# Patient Record
Sex: Male | Born: 1955 | Race: Black or African American | Hispanic: No | Smoking: Never smoker
Health system: Southern US, Community
[De-identification: ages and names within clinical notes are randomized; demographics above are authoritative.]

## PROBLEM LIST (undated history)

## (undated) HISTORY — PX: HERNIA REPAIR: SHX51

---

## 1998-06-11 ENCOUNTER — Emergency Department (HOSPITAL_COMMUNITY): Admission: EM | Admit: 1998-06-11 | Discharge: 1998-06-11 | Payer: Self-pay | Admitting: Internal Medicine

## 1998-06-19 ENCOUNTER — Emergency Department (HOSPITAL_COMMUNITY): Admission: EM | Admit: 1998-06-19 | Discharge: 1998-06-19 | Payer: Self-pay | Admitting: Emergency Medicine

## 1999-02-07 ENCOUNTER — Encounter: Payer: Self-pay | Admitting: Emergency Medicine

## 1999-02-07 ENCOUNTER — Emergency Department (HOSPITAL_COMMUNITY): Admission: EM | Admit: 1999-02-07 | Discharge: 1999-02-07 | Payer: Self-pay | Admitting: Emergency Medicine

## 2002-01-26 ENCOUNTER — Emergency Department (HOSPITAL_COMMUNITY): Admission: EM | Admit: 2002-01-26 | Discharge: 2002-01-26 | Payer: Self-pay | Admitting: Emergency Medicine

## 2002-09-15 ENCOUNTER — Emergency Department (HOSPITAL_COMMUNITY): Admission: EM | Admit: 2002-09-15 | Discharge: 2002-09-15 | Payer: Self-pay | Admitting: Emergency Medicine

## 2005-10-13 ENCOUNTER — Emergency Department (HOSPITAL_COMMUNITY): Admission: EM | Admit: 2005-10-13 | Discharge: 2005-10-13 | Payer: Self-pay | Admitting: Emergency Medicine

## 2010-05-18 ENCOUNTER — Emergency Department (HOSPITAL_COMMUNITY): Payer: BC Managed Care – PPO

## 2010-05-18 ENCOUNTER — Emergency Department (HOSPITAL_COMMUNITY)
Admission: EM | Admit: 2010-05-18 | Discharge: 2010-05-18 | Disposition: A | Discharge status: Home / Self Care | Payer: BC Managed Care – PPO | Attending: Emergency Medicine | Admitting: Emergency Medicine

## 2010-05-18 DIAGNOSIS — R1032 Left lower quadrant pain: Secondary | ICD-10-CM | POA: Insufficient documentation

## 2010-05-18 DIAGNOSIS — M5430 Sciatica, unspecified side: Secondary | ICD-10-CM | POA: Insufficient documentation

## 2010-05-18 DIAGNOSIS — N400 Enlarged prostate without lower urinary tract symptoms: Secondary | ICD-10-CM | POA: Insufficient documentation

## 2010-05-18 DIAGNOSIS — M79609 Pain in unspecified limb: Secondary | ICD-10-CM | POA: Insufficient documentation

## 2010-05-18 DIAGNOSIS — R030 Elevated blood-pressure reading, without diagnosis of hypertension: Secondary | ICD-10-CM | POA: Insufficient documentation

## 2010-05-18 LAB — COMPREHENSIVE METABOLIC PANEL
ALT: 22 U/L (ref 0–53)
AST: 20 U/L (ref 0–37)
Albumin: 4.2 g/dL (ref 3.5–5.2)
Alkaline Phosphatase: 76 U/L (ref 39–117)
BUN: 11 mg/dL (ref 6–23)
CO2: 26 mEq/L (ref 19–32)
Calcium: 9.6 mg/dL (ref 8.4–10.5)
Chloride: 100 mEq/L (ref 96–112)
Creatinine, Ser: 0.76 mg/dL (ref 0.4–1.5)
GFR calc Af Amer: >60 mL/min (ref >60)
GFR calc non Af Amer: >60 mL/min (ref >60)
Glucose, Bld: 104 mg/dL — ABNORMAL HIGH (ref 70–99)
Potassium: 3.4 mEq/L — ABNORMAL LOW (ref 3.5–5.1)
Sodium: 135 mEq/L (ref 135–145)
Total Bilirubin: 0.4 mg/dL (ref 0.3–1.2)
Total Protein: 7.3 g/dL (ref 6.0–8.3)

## 2010-05-18 LAB — DIFFERENTIAL
Basophils Absolute: 0 10*3/uL (ref 0.0–0.1)
Basophils Relative: 0 % (ref 0–1)
Eosinophils Absolute: 0.1 10*3/uL (ref 0.0–0.7)
Eosinophils Relative: 1 % (ref 0–5)
Lymphocytes Relative: 13 % (ref 12–46)
Lymphs Abs: 1.3 10*3/uL (ref 0.7–4.0)
Monocytes Absolute: 0.7 10*3/uL (ref 0.1–1.0)
Monocytes Relative: 8 % (ref 3–12)
Neutro Abs: 7.4 10*3/uL (ref 1.7–7.7)
Neutrophils Relative %: 78 % — ABNORMAL HIGH (ref 43–77)

## 2010-05-18 LAB — CBC
HCT: 42.7 % (ref 39.0–52.0)
Hemoglobin: 14.4 g/dL (ref 13.0–17.0)
MCH: 28 pg (ref 26.0–34.0)
MCHC: 33.7 g/dL (ref 30.0–36.0)
MCV: 83.1 fL (ref 78.0–100.0)
Platelets: 182 10*3/uL (ref 150–400)
RBC: 5.14 MIL/uL (ref 4.22–5.81)
RDW: 12.9 % (ref 11.5–15.5)
WBC: 9.4 10*3/uL (ref 4.0–10.5)

## 2010-05-18 LAB — URINALYSIS, ROUTINE W REFLEX MICROSCOPIC
Bilirubin Urine: NEGATIVE
Glucose, UA: NEGATIVE mg/dL
Hgb urine dipstick: NEGATIVE
Ketones, ur: NEGATIVE mg/dL
Nitrite: NEGATIVE
Protein, ur: NEGATIVE mg/dL
Specific Gravity, Urine: 1.028 (ref 1.005–1.030)
Urobilinogen, UA: 0.2 mg/dL (ref 0.0–1.0)
pH: 5.5 (ref 5.0–8.0)

## 2010-05-18 MED ORDER — IOHEXOL 300 MG/ML  SOLN
100.0000 mL | Freq: Once | INTRAMUSCULAR | Status: AC | PRN
  Administered 2010-05-18: 100 mL via INTRAVENOUS

## 2010-05-19 LAB — URINE CULTURE
Colony Count: NO GROWTH
Culture  Setup Time: 201205091021
Culture: NO GROWTH

## 2010-11-09 ENCOUNTER — Inpatient Hospital Stay (INDEPENDENT_AMBULATORY_CARE_PROVIDER_SITE_OTHER)
Admission: RE | Admit: 2010-11-09 | Discharge: 2010-11-09 | Disposition: A | Discharge status: Home / Self Care | Payer: BC Managed Care – PPO | Source: Ambulatory Visit | Attending: Emergency Medicine | Admitting: Emergency Medicine

## 2010-11-09 DIAGNOSIS — L723 Sebaceous cyst: Secondary | ICD-10-CM

## 2013-12-15 ENCOUNTER — Emergency Department (HOSPITAL_COMMUNITY)
Admission: EM | Admit: 2013-12-15 | Discharge: 2013-12-15 | Disposition: A | Discharge status: Home / Self Care | Payer: BC Managed Care – PPO | Attending: Emergency Medicine | Admitting: Emergency Medicine

## 2013-12-15 ENCOUNTER — Encounter (HOSPITAL_COMMUNITY): Payer: Self-pay | Admitting: Emergency Medicine

## 2013-12-15 DIAGNOSIS — L02212 Cutaneous abscess of back [any part, except buttock]: Secondary | ICD-10-CM | POA: Diagnosis present

## 2013-12-15 DIAGNOSIS — L723 Sebaceous cyst: Secondary | ICD-10-CM | POA: Diagnosis not present

## 2013-12-15 NOTE — ED Notes (Signed)
Pt in a hurry to leave, did not want d/c vitals taken.

## 2013-12-15 NOTE — Discharge Instructions (Signed)
Epidermal Cyst An epidermal cyst is usually a small, painless lump under the skin. Cysts often occur on the face, neck, stomach, chest, or genitals. The cyst may be filled with a bad smelling paste. Do not pop your cyst. Popping the cyst can cause pain and puffiness (swelling). HOME CARE   Only take medicines as told by your doctor.  Take your medicine (antibiotics) as told. Finish it even if you start to feel better. GET HELP RIGHT AWAY IF:  Your cyst is tender, red, or puffy.  You are not getting better, or you are getting worse.  You have any questions or concerns. MAKE SURE YOU:  Understand these instructions.  Will watch your condition.  Will get help right away if you are not doing well or get worse. Document Released: 02/03/2004 Document Revised: 06/27/2011 Document Reviewed: 07/04/2010 [facility] Patient Information 2015 [employer], [place]. This information is not intended to replace advice given to you by your health care provider. Make sure you discuss any questions you have with your health care provider.   Emergency Department Resource Guide 1) Find a Doctor and Pay Out of Pocket Although you won't have to find out who is covered by your insurance plan, it is a good idea to ask around and get recommendations. You will then need to call the office and see if the doctor you have chosen will accept you as a new patient and what types of options they offer for patients who are self-pay. Some doctors offer discounts or will set up payment plans for their patients who do not have insurance, but you will need to ask so you aren't surprised when you get to your appointment.  2) Contact Your Local Health Department Not all health departments have doctors that can see patients for sick visits, but many do, so it is worth a call to see if yours does. If you don't know where your local health department is, you can check in your phone book. The CDC also has a tool to help you locate your  state's health department, and many state websites also have listings of all of their local health departments.  3) Find a Walk-in Clinic If your illness is not likely to be very severe or complicated, you may want to try a walk in clinic. These are popping up all over the country in pharmacies, drugstores, and shopping centers. They're usually staffed by nurse practitioners or physician assistants that have been trained to treat common illnesses and complaints. They're usually fairly quick and inexpensive. However, if you have serious medical issues or chronic medical problems, these are probably not your best option.  No Primary Care Doctor: - Call Health Connect at  [phone] - they can help you locate a primary care doctor that  accepts your insurance, provides certain services, etc. - Physician Referral Service- [phone]  Chronic Pain Problems: Organization         Address  Phone   Notes  [facility]  [phone] Patients need to be referred by their primary care doctor.   Medication Assistance: Organization         Address  Phone   Notes  [facility] [address] [place], [place] [address] [phone] --Must be a resident of [facility] [place] -- Must have NO insurance coverage whatsoever (no Medicaid/ Medicare, etc.) -- The pt. MUST have a primary care doctor that directs their care regularly and follows them in the community  Med[facility]ist  [phone]   [employer]  [phone]    Agencies that provide inexpensive medical care: Organization         Address  Phone   Notes  [facility]  [phone]   [facility]    [phone]   [facility] [address] [place], [place] [address] [phone]   [facility] [address] [place], [place] [phone]   Planned Parenthood    [phone]   [facility]    [phone]   Community Health and [facility]  [address], [place] Phone:  [phone], Fax:  [phone] Hours of Operation:  9 am - 6 pm, M-F.  Also accepts Medicaid/Medicare and self-pay.  [facility]  [address], [place] Phone: [phone], Fax: [phone]. Hours of Operation:  8:30 am - 5:30 pm, M-F.  Also accepts Medicaid and self-pay.  [facility] [place] [address] [place] Phone: [phone]   [facility] [address] Natasha Bence [place], [place] [phone], Ext. [phone] Mondays & Thursdays: 7-9 AM.  First 15 patients are seen on a first come, first serve basis.    Medicaid-accepting [facility] Providers:  Organization         Address  Phone   Notes  [facility] [address], [place] [phone] Also accepts self-pay patients.  [facility] [address] Laurell Josephs [place], [place]  [phone]   [facility] [address], [place] [phone]   [facility] [address] [place] [phone]   Renaye Rakers [address], [place]   [phone] Only accepts [place] Access [place] patients after they have their name applied to their card.   Self-Pay (no insurance) in [facility]:  Organization         Address  Phone   Notes  Sickle Cell Patients, [facility] [address] [place], [place] [phone]   [facility] [address] [place], [place] [phone]   [facility] [place]  [address], [place] [phone]   [facility]/Dr. Osei-Bonsu  [address] [place] or [address], [place] [phone] Phone number for both [place] and [place] locations is the same.  [facility] and [facility] [address] [place] [phone]   [facility] [address] [place] or [address] [phone]   [facility] [address], [place] [phone], phone; [phone], fax Sees patients 1st and 3rd Saturday of every month.  Must not qualify for public or private insurance (i.e. Medicaid, Medicare, [place] Health Choice, Veterans' Benefits)  Household income should be no more than 200% of the poverty level The clinic cannot treat you if you are pregnant or think you are pregnant  Sexually transmitted diseases are not treated at the clinic.    Dental Care: Organization         Address  Phone  Notes  [facility] [address] [place], [place] [phone] Accepts children up to age 38 who are enrolled in [place] or [place] Health Choice;  pregnant women with a Medicaid card; and children who have applied for Medicaid or [place] Health Choice, but were declined, whose parents can pay a reduced fee at time of service.  [facility] of [facility] [place]  [address], [place] [phone] Accepts children up to age 57 who are enrolled in [place] or [place] Health Choice; pregnant women with a Medicaid card; and children who have applied for Medicaid or [place] Health Choice, but were declined, whose parents can pay a reduced fee at time of service.  [facility]  [address], [place] [phone] Patients are seen by appointment only. Walk-ins are not accepted. [facility] will see patients 57 years of age and older. Monday - Tuesday (8am-5pm) Most Wednesdays (8:30-5pm) $30 per visit, cash only  [facility]  [address], [facility] [place] [phone]) [phone] Patients are seen by appointment only. Walk-ins are not accepted. [facility] will see patients 57 years of age and older. One Wednesday Evening (Monthly: Volunteer  Based).  $30 per visit, cash only  [employer] [facility]  [phone] for adults; Children under age 634, call Graduate Pediatric Dentistry at [phone]. Children aged 724-14, please call [phone] to request a pediatric application.  Dental services are provided in all areas of dental care including fillings, crowns and bridges, complete and partial dentures, implants, gum treatment, root canals, and extractions. Preventive care is also provided. Treatment is provided to both adults and children. Patients are selected via a lottery and there is often a waiting list.   [facility] [address], [place]  [phone] [email or web address]   [facility] [address], [place], [place] [phone], Ext. [phone] Second and Fourth Thursday of each month, opens at 6:30 AM; Clinic ends at 9 AM.  Patients are seen on a first-come first-served basis, and a limited number are seen during each clinic.   [facility]  [address], [place], [place] [phone]   Eligibility Requirements You must have lived in [place], [place], or [place] counties for at least the last three months.   You cannot be eligible for state or federal sponsored [place] Cityhealthcare insurance, including CIGNAVeterans Administration, [place]icaid, or [employer] EntertainmentMedicare.   You generally cannot be eligible for healthcare insurance through your employer.    How to apply: Eligibility screenings are held every Tuesday and Wednesday afternoon from 1:00 pm until 4:00 pm. You do not need an appointment for the interview!  [facility] [address], [place], [place] [phone]   [facility]  [phone]   [facility]  [phone]   [facility]  [phone]    Behavioral Health Resources in the Community: Intensive Outpatient Programs Organization         Address  Phone  Notes  [facility]  Services [address], [place], [place] [phone]   [facility] Outpatient [address], [place], [place] [phone]   [facility] [address], [place], [place]  [phone]   [facility] [address],  [place], [place] [phone] or [phone]   Substance Abuse Resources Organization         Address  Phone  Notes  [facility] Drug Services  [phone]   [facility]  [phone]   [facility]  [phone]  Floydene Flock  [phone]   Residential & Outpatient Substance Abuse Program  [phone]   Psychological Services Organization         Address  Phone  Notes  [facility] Behavior[facility] Health  [phone]   [facility] Services  [phone]   [facility] [address] [place] [phone] or [phone]    Mobile Crisis Teams Organization         Address  Phone  Notes  [facility], Mobile Crisis Care Unit  [phone]   [facility]  [address] [place], [place] [phone]   Doristine Locks [address] [place] [phone]    Self-Help/Support Groups Organization         Address  Phone             Notes  [facility] [place] - variety of support groups  336- I7437963 Call for more information  Narcotics Anonymous (NA), [facility] [address], [employer] [place]  2 meetings at this location   Statistician         Address  Phone  Notes  [facility] [address],    [place]  [phone]   [facility]  [address], [place] [address], [place], [place] [phone]   [facility] [address] [place], [place] [phone] Admissions: 8am-3pm M-F  [facility] [address] [place] [address],    [place], [place] [phone]   [facility] [address] [place], [place], [place] [phone]    [facility] [address],  [place], [place] [phone]   [facility] - Intensive Outpatient [address] Laurell Josephs [address], [place], [place] [phone]   [facility] ([facility] [address]  [place], [place] [phone] or [phone]   [facility] (RTS) [address] [place], [place] [phone] Accepts Medicaid  [facility] [address],  [place] [phone] Substance Abuse/Addiction Treatment   [facility] Organization         Address  Phone  Notes  [facility]  2102278005   Angie Fava, PhD [address] Ervin Knack [place], [place]   [phone] or [phone]   [facility] Behavioral   [address] [place], [place] [phone]   [facility] [address] [place], [place] [phone] Insurance/Medicaid/sponsorship through [facility] and Families [address]                                    [place], [place] [phone] Therapy/tele-psych/case  [facility] [address], [place] [phone]    Dr. Lolly Mustache  [phone]   [facility]  [employer] [facility] Dept. 1) [address], [place] 2) [address], [place] 3)  [address], [place] [phone]  [phone]   [facility] Child Abuse Hotline (267)787-9081 or [phone] (After Hours)

## 2013-12-15 NOTE — ED Notes (Signed)
Pt states he had a "bump" on his back which he had drained 2 years ago, pt states "bump" has returned and has been bleeding intermittently for 3 weeks.

## 2013-12-15 NOTE — ED Provider Notes (Signed)
CSN: [id number]     Arrival date & time 12/15/13  1257 History  This chart was scribed for non-physician practitioner, Terri Piedraourtney Forcucci, PA-C working with Rolan BuccoMelanie Belfi, MD by Greggory StallionKayla Andersen, ED scribe. This patient was seen in room WTR8/WTR8 and the patient's care was started at 2:46 PM.   Chief Complaint  Patient presents with  . Mass   The history is provided by the patient. No language interpreter was used.    HPI Comments: Shaun Santana is a 58 y.o. male who presents to the Emergency Department complaining of an abscess to his back that started 3 weeks ago. Reports itching and intermittent blood drainage from the area. Pt states there is only pain when lays down. He has done warm compresses with some relief. States he had the same symptoms about 2 years ago and had the area drained. Denies fever, chills, nausea, emesis, warmth or redness around the area. Pt does not have a PCP. Denies history of skin cancer.   No past medical history on file. Past Surgical History  Procedure Laterality Date  . Hernia repair     No family history on file. History  Substance Use Topics  . Smoking status: Never Smoker   . Smokeless tobacco: Not on file  . Alcohol Use: Yes     Comment: socially    Review of Systems  Constitutional: Negative for fever and chills.  Gastrointestinal: Negative for nausea and vomiting.  Skin: Negative for color change.       Abscess  All other systems reviewed and are negative.  Allergies  Review of patient's allergies indicates no known allergies.  Home Medications   Prior to Admission medications   Not on File   BP 145/85 mmHg  Pulse 92  Temp(Src) 97.9 F (36.6 C) (Oral)  Resp 18  SpO2 99%  Physical Exam  Constitutional: He is oriented to person, place, and time. He appears well-developed and well-nourished. No distress.  HENT:  Head: Normocephalic and atraumatic.  Nose: Nose normal.  Mouth/Throat: Oropharynx is clear and moist.  Eyes:  Conjunctivae and EOM are normal. Pupils are equal, round, and reactive to light.  Neck: Neck supple. No tracheal deviation present.  Cardiovascular: Normal rate, regular rhythm and normal heart sounds.  Exam reveals no gallop and no friction rub.   No murmur heard. Pulmonary/Chest: Effort normal and breath sounds normal. No respiratory distress. He has no wheezes. He has no rhonchi. He has no rales.  Musculoskeletal: Normal range of motion.  Neurological: He is alert and oriented to person, place, and time.  Skin: Skin is warm and dry.  1 cm x 0.5 cm area of firmness with central scabbing. No active discharge. Feels firm to palpation with no induration or fluctuance. No surrounding erythema or warmth upon palpation.   Psychiatric: He has a normal mood and affect. His behavior is normal.  Nursing note and vitals reviewed.   ED Course  Procedures (including critical care time)  DIAGNOSTIC STUDIES: Oxygen Saturation is 99% on RA, normal by my interpretation.    COORDINATION OF CARE: 2:54 PM-Discussed treatment plan which includes continuing warm compresses with pt at bedside and pt agreed to plan. Will give pt dermatology referral and advised him to follow up. Return precautions given.   Labs Review Labs Reviewed - No data to display  Imaging Review No results found.   EKG Interpretation None      MDM   Final diagnoses:  Sebaceous cyst   Patient is a  58 year old male who presents emergency room for evaluation of a mass. Mass per the patient reports has been actively draining. There is no surrounding redness or warmth on examination. Suspect that this might be likely a sebaceous cyst versus an abscess. Given that the cyst is actively draining and there are no signs of infection at this time I will not perform an I&D. I suspect that this is likely more a sebaceous cyst and needs to be seen by a dermatologist for likely excision. Patient to return for signs of infection which we  discussed here. Patient states understanding and agreement at this time. I've encouraged him to use warm compresses. Patient states understanding and agreement. Patient is stable for discharge.  I personally performed the services described in this documentation, which was scribed in my presence. The recorded information has been reviewed and is accurate.  Eben Burowourtney A Forcucci, PA-C 12/15/13 1519  Rolan BuccoMelanie Belfi, MD 12/15/13 1601

## 2014-06-19 ENCOUNTER — Emergency Department (HOSPITAL_COMMUNITY): Payer: BC Managed Care – PPO | Admitting: Certified Registered Nurse Anesthetist

## 2014-06-19 ENCOUNTER — Inpatient Hospital Stay (HOSPITAL_COMMUNITY)
Admission: EM | Admit: 2014-06-19 | Discharge: 2014-06-25 | DRG: 328 | Disposition: A | Discharge status: Home / Self Care | Payer: BC Managed Care – PPO | Attending: General Surgery | Admitting: General Surgery

## 2014-06-19 ENCOUNTER — Inpatient Hospital Stay (HOSPITAL_COMMUNITY): Payer: BC Managed Care – PPO

## 2014-06-19 ENCOUNTER — Encounter (HOSPITAL_COMMUNITY): Payer: Self-pay | Admitting: Emergency Medicine

## 2014-06-19 ENCOUNTER — Emergency Department (HOSPITAL_COMMUNITY): Payer: BC Managed Care – PPO

## 2014-06-19 ENCOUNTER — Encounter (HOSPITAL_COMMUNITY): Admission: EM | Disposition: A | Discharge status: Home / Self Care | Payer: Self-pay | Source: Home / Self Care

## 2014-06-19 DIAGNOSIS — B9681 Helicobacter pylori [H. pylori] as the cause of diseases classified elsewhere: Secondary | ICD-10-CM | POA: Diagnosis present

## 2014-06-19 DIAGNOSIS — K275 Chronic or unspecified peptic ulcer, site unspecified, with perforation: Secondary | ICD-10-CM | POA: Diagnosis present

## 2014-06-19 DIAGNOSIS — Z4659 Encounter for fitting and adjustment of other gastrointestinal appliance and device: Secondary | ICD-10-CM

## 2014-06-19 DIAGNOSIS — K255 Chronic or unspecified gastric ulcer with perforation: Secondary | ICD-10-CM | POA: Diagnosis present

## 2014-06-19 DIAGNOSIS — R1011 Right upper quadrant pain: Secondary | ICD-10-CM | POA: Diagnosis present

## 2014-06-19 DIAGNOSIS — K279 Peptic ulcer, site unspecified, unspecified as acute or chronic, without hemorrhage or perforation: Secondary | ICD-10-CM

## 2014-06-19 DIAGNOSIS — K253 Acute gastric ulcer without hemorrhage or perforation: Secondary | ICD-10-CM

## 2014-06-19 DIAGNOSIS — K219 Gastro-esophageal reflux disease without esophagitis: Secondary | ICD-10-CM | POA: Diagnosis present

## 2014-06-19 DIAGNOSIS — K631 Perforation of intestine (nontraumatic): Secondary | ICD-10-CM

## 2014-06-19 DIAGNOSIS — Z6833 Body mass index (BMI) 33.0-33.9, adult: Secondary | ICD-10-CM

## 2014-06-19 DIAGNOSIS — K668 Other specified disorders of peritoneum: Secondary | ICD-10-CM | POA: Diagnosis present

## 2014-06-19 DIAGNOSIS — R911 Solitary pulmonary nodule: Secondary | ICD-10-CM | POA: Diagnosis present

## 2014-06-19 DIAGNOSIS — N400 Enlarged prostate without lower urinary tract symptoms: Secondary | ICD-10-CM | POA: Diagnosis present

## 2014-06-19 DIAGNOSIS — R109 Unspecified abdominal pain: Secondary | ICD-10-CM

## 2014-06-19 HISTORY — PX: LAPAROSCOPY: SHX197

## 2014-06-19 LAB — CBC WITH DIFFERENTIAL/PLATELET
BASOS ABS: 0 10*3/uL (ref 0.0–0.1)
Basophils Relative: 1 % (ref 0–1)
EOS PCT: 2 % (ref 0–5)
Eosinophils Absolute: 0.1 10*3/uL (ref 0.0–0.7)
HEMATOCRIT: 44.6 % (ref 39.0–52.0)
Hemoglobin: 14.8 g/dL (ref 13.0–17.0)
Lymphocytes Relative: 42 % (ref 12–46)
Lymphs Abs: 3.5 10*3/uL (ref 0.7–4.0)
MCH: 27.9 pg (ref 26.0–34.0)
MCHC: 33.2 g/dL (ref 30.0–36.0)
MCV: 84.2 fL (ref 78.0–100.0)
MONO ABS: 0.6 10*3/uL (ref 0.1–1.0)
Monocytes Relative: 7 % (ref 3–12)
NEUTROS ABS: 4 10*3/uL (ref 1.7–7.7)
NEUTROS PCT: 48 % (ref 43–77)
PLATELETS: 229 10*3/uL (ref 150–400)
RBC: 5.3 MIL/uL (ref 4.22–5.81)
RDW: 13.1 % (ref 11.5–15.5)
WBC: 8.3 10*3/uL (ref 4.0–10.5)

## 2014-06-19 LAB — COMPREHENSIVE METABOLIC PANEL
ALT: 40 U/L (ref 17–63)
AST: 35 U/L (ref 15–41)
Albumin: 4.1 g/dL (ref 3.5–5.0)
Alkaline Phosphatase: 75 U/L (ref 38–126)
Anion gap: 13 (ref 5–15)
BUN: 12 mg/dL (ref 6–20)
CALCIUM: 8.9 mg/dL (ref 8.9–10.3)
CHLORIDE: 104 mmol/L (ref 101–111)
CO2: 22 mmol/L (ref 22–32)
CREATININE: 0.88 mg/dL (ref 0.61–1.24)
GFR calc Af Amer: >60 mL/min (ref >60)
Glucose, Bld: 190 mg/dL — ABNORMAL HIGH (ref 65–99)
POTASSIUM: 3.5 mmol/L (ref 3.5–5.1)
SODIUM: 139 mmol/L (ref 135–145)
Total Bilirubin: 0.5 mg/dL (ref 0.3–1.2)
Total Protein: 7.1 g/dL (ref 6.5–8.1)

## 2014-06-19 LAB — LIPASE, BLOOD: LIPASE: 27 U/L (ref 22–51)

## 2014-06-19 SURGERY — LAPAROSCOPY, DIAGNOSTIC
Anesthesia: General | Site: Abdomen

## 2014-06-19 MED ORDER — HYDROMORPHONE HCL 1 MG/ML IJ SOLN
INTRAMUSCULAR | Status: AC
  Filled 2014-06-19: qty 1

## 2014-06-19 MED ORDER — ALBUMIN HUMAN 5 % IV SOLN
INTRAVENOUS | Status: DC | PRN
  Administered 2014-06-19: 09:00:00 via INTRAVENOUS

## 2014-06-19 MED ORDER — SODIUM CHLORIDE 0.9 % IJ SOLN
INTRAMUSCULAR | Status: AC
  Filled 2014-06-19: qty 10

## 2014-06-19 MED ORDER — HYDROMORPHONE HCL 1 MG/ML IJ SOLN
1.0000 mg | INTRAMUSCULAR | Status: DC | PRN
  Administered 2014-06-19 – 2014-06-24 (×23): 1 mg via INTRAVENOUS
  Filled 2014-06-19 (×23): qty 1

## 2014-06-19 MED ORDER — PANTOPRAZOLE SODIUM 40 MG IV SOLR
40.0000 mg | Freq: Two times a day (BID) | INTRAVENOUS | Status: DC
  Administered 2014-06-19 – 2014-06-23 (×9): 40 mg via INTRAVENOUS
  Filled 2014-06-19 (×11): qty 40

## 2014-06-19 MED ORDER — 0.9 % SODIUM CHLORIDE (POUR BTL) OPTIME
TOPICAL | Status: DC | PRN
  Administered 2014-06-19 (×2): 1000 mL

## 2014-06-19 MED ORDER — LIDOCAINE HCL (CARDIAC) 20 MG/ML IV SOLN
INTRAVENOUS | Status: AC
  Filled 2014-06-19: qty 5

## 2014-06-19 MED ORDER — METRONIDAZOLE IN NACL 5-0.79 MG/ML-% IV SOLN
500.0000 mg | Freq: Once | INTRAVENOUS | Status: AC
  Administered 2014-06-19: 500 mg via INTRAVENOUS
  Filled 2014-06-19: qty 100

## 2014-06-19 MED ORDER — PROPOFOL 10 MG/ML IV BOLUS
INTRAVENOUS | Status: AC
  Filled 2014-06-19: qty 20

## 2014-06-19 MED ORDER — FENTANYL CITRATE (PF) 250 MCG/5ML IJ SOLN
INTRAMUSCULAR | Status: AC
  Filled 2014-06-19: qty 5

## 2014-06-19 MED ORDER — ONDANSETRON HCL 4 MG/2ML IJ SOLN
INTRAMUSCULAR | Status: AC
  Filled 2014-06-19: qty 2

## 2014-06-19 MED ORDER — KCL IN DEXTROSE-NACL 20-5-0.45 MEQ/L-%-% IV SOLN
INTRAVENOUS | Status: AC
  Filled 2014-06-19: qty 1000

## 2014-06-19 MED ORDER — PROMETHAZINE HCL 25 MG/ML IJ SOLN: 6.2500 mg | INTRAMUSCULAR | Status: DC | PRN

## 2014-06-19 MED ORDER — ROCURONIUM BROMIDE 50 MG/5ML IV SOLN
INTRAVENOUS | Status: AC
  Filled 2014-06-19: qty 1

## 2014-06-19 MED ORDER — MIDAZOLAM HCL 2 MG/2ML IJ SOLN
INTRAMUSCULAR | Status: AC
  Filled 2014-06-19: qty 2

## 2014-06-19 MED ORDER — HYDROMORPHONE HCL 1 MG/ML IJ SOLN
1.0000 mg | Freq: Once | INTRAMUSCULAR | Status: AC
  Administered 2014-06-19: 1 mg via INTRAVENOUS
  Filled 2014-06-19: qty 1

## 2014-06-19 MED ORDER — CHLORHEXIDINE GLUCONATE 0.12 % MT SOLN
15.0000 mL | Freq: Two times a day (BID) | OROMUCOSAL | Status: DC
  Administered 2014-06-19 – 2014-06-24 (×11): 15 mL via OROMUCOSAL
  Filled 2014-06-19 (×11): qty 15

## 2014-06-19 MED ORDER — KCL IN DEXTROSE-NACL 20-5-0.9 MEQ/L-%-% IV SOLN
INTRAVENOUS | Status: DC
  Administered 2014-06-19 – 2014-06-24 (×10): via INTRAVENOUS
  Filled 2014-06-19 (×13): qty 1000

## 2014-06-19 MED ORDER — NEOSTIGMINE METHYLSULFATE 10 MG/10ML IV SOLN
INTRAVENOUS | Status: DC | PRN
  Administered 2014-06-19: 4 mg via INTRAVENOUS

## 2014-06-19 MED ORDER — HYDROMORPHONE HCL 1 MG/ML IJ SOLN
0.2500 mg | INTRAMUSCULAR | Status: DC | PRN
  Administered 2014-06-19 (×2): 0.5 mg via INTRAVENOUS

## 2014-06-19 MED ORDER — PROMETHAZINE HCL 25 MG/ML IJ SOLN
INTRAMUSCULAR | Status: AC
  Administered 2014-06-19: 6.25 mg
  Filled 2014-06-19: qty 1

## 2014-06-19 MED ORDER — SCOPOLAMINE 1 MG/3DAYS TD PT72
MEDICATED_PATCH | TRANSDERMAL | Status: AC
  Filled 2014-06-19: qty 1

## 2014-06-19 MED ORDER — BUPIVACAINE-EPINEPHRINE (PF) 0.25% -1:200000 IJ SOLN
INTRAMUSCULAR | Status: AC
  Filled 2014-06-19: qty 30

## 2014-06-19 MED ORDER — SCOPOLAMINE 1 MG/3DAYS TD PT72
1.0000 | MEDICATED_PATCH | TRANSDERMAL | Status: DC
  Administered 2014-06-19: 1.5 mg via TRANSDERMAL

## 2014-06-19 MED ORDER — FENTANYL CITRATE (PF) 100 MCG/2ML IJ SOLN
INTRAMUSCULAR | Status: AC
  Administered 2014-06-19 (×2): 50 ug via INTRAVENOUS
  Administered 2014-06-19: 100 ug via INTRAVENOUS
  Administered 2014-06-19 (×3): 50 ug via INTRAVENOUS
  Filled 2014-06-19: qty 2

## 2014-06-19 MED ORDER — CETYLPYRIDINIUM CHLORIDE 0.05 % MT LIQD
7.0000 mL | Freq: Two times a day (BID) | OROMUCOSAL | Status: DC
  Administered 2014-06-20 – 2014-06-24 (×10): 7 mL via OROMUCOSAL

## 2014-06-19 MED ORDER — GLYCOPYRROLATE 0.2 MG/ML IJ SOLN
INTRAMUSCULAR | Status: AC
  Filled 2014-06-19: qty 3

## 2014-06-19 MED ORDER — BUPIVACAINE-EPINEPHRINE 0.25% -1:200000 IJ SOLN
INTRAMUSCULAR | Status: DC | PRN
  Administered 2014-06-19: 20 mL

## 2014-06-19 MED ORDER — NEOSTIGMINE METHYLSULFATE 10 MG/10ML IV SOLN
INTRAVENOUS | Status: AC
  Filled 2014-06-19: qty 1

## 2014-06-19 MED ORDER — PROPOFOL 10 MG/ML IV BOLUS
INTRAVENOUS | Status: DC | PRN
  Administered 2014-06-19: 200 mg via INTRAVENOUS
  Administered 2014-06-19 (×3): 50 mg via INTRAVENOUS

## 2014-06-19 MED ORDER — DEXAMETHASONE SODIUM PHOSPHATE 4 MG/ML IJ SOLN
INTRAMUSCULAR | Status: DC | PRN
  Administered 2014-06-19: 10 mg via INTRAVENOUS

## 2014-06-19 MED ORDER — SODIUM CHLORIDE 0.9 % IV SOLN
80.0000 mg | Freq: Once | INTRAVENOUS | Status: AC
  Administered 2014-06-19: 80 mg via INTRAVENOUS
  Filled 2014-06-19: qty 80

## 2014-06-19 MED ORDER — PIPERACILLIN-TAZOBACTAM 3.375 G IVPB 30 MIN
3.3750 g | Freq: Once | INTRAVENOUS | Status: AC
  Administered 2014-06-19: 3.375 g via INTRAVENOUS
  Filled 2014-06-19: qty 50

## 2014-06-19 MED ORDER — HYDROMORPHONE HCL 1 MG/ML IJ SOLN
INTRAMUSCULAR | Status: AC
Start: 2014-06-19 — End: 2014-06-19
  Filled 2014-06-19: qty 1

## 2014-06-19 MED ORDER — HYDROMORPHONE HCL 1 MG/ML IJ SOLN
2.0000 mg | Freq: Once | INTRAMUSCULAR | Status: AC
  Administered 2014-06-19: 2 mg via INTRAVENOUS
  Filled 2014-06-19: qty 2

## 2014-06-19 MED ORDER — LIDOCAINE HCL (CARDIAC) 20 MG/ML IV SOLN
INTRAVENOUS | Status: DC | PRN
  Administered 2014-06-19: 100 mg via INTRAVENOUS

## 2014-06-19 MED ORDER — SODIUM CHLORIDE 0.9 % IV BOLUS (SEPSIS)
1000.0000 mL | Freq: Once | INTRAVENOUS | Status: AC
Start: 2014-06-19 — End: 2014-06-19
  Administered 2014-06-19: 1000 mL via INTRAVENOUS

## 2014-06-19 MED ORDER — LACTATED RINGERS IV SOLN
INTRAVENOUS | Status: DC
  Administered 2014-06-19 (×3): via INTRAVENOUS

## 2014-06-19 MED ORDER — SUCCINYLCHOLINE CHLORIDE 20 MG/ML IJ SOLN
INTRAMUSCULAR | Status: AC
  Filled 2014-06-19: qty 1

## 2014-06-19 MED ORDER — ROCURONIUM BROMIDE 100 MG/10ML IV SOLN
INTRAVENOUS | Status: DC | PRN
  Administered 2014-06-19: 50 mg via INTRAVENOUS

## 2014-06-19 MED ORDER — ONDANSETRON HCL 4 MG/2ML IJ SOLN
4.0000 mg | Freq: Four times a day (QID) | INTRAMUSCULAR | Status: DC | PRN
  Administered 2014-06-19 – 2014-06-22 (×5): 4 mg via INTRAVENOUS
  Filled 2014-06-19 (×6): qty 2

## 2014-06-19 MED ORDER — NALOXONE HCL 0.4 MG/ML IJ SOLN
INTRAMUSCULAR | Status: AC
  Filled 2014-06-19: qty 1

## 2014-06-19 MED ORDER — SUCCINYLCHOLINE CHLORIDE 20 MG/ML IJ SOLN
INTRAMUSCULAR | Status: DC | PRN
  Administered 2014-06-19: 120 mg via INTRAVENOUS

## 2014-06-19 MED ORDER — GLYCOPYRROLATE 0.2 MG/ML IJ SOLN
INTRAMUSCULAR | Status: DC | PRN
  Administered 2014-06-19: 0.6 mg via INTRAVENOUS

## 2014-06-19 MED ORDER — KETOROLAC TROMETHAMINE 30 MG/ML IJ SOLN
30.0000 mg | Freq: Once | INTRAMUSCULAR | Status: AC
  Administered 2014-06-19: 30 mg via INTRAVENOUS
  Filled 2014-06-19: qty 1

## 2014-06-19 MED ORDER — ONDANSETRON HCL 4 MG/2ML IJ SOLN
4.0000 mg | Freq: Once | INTRAMUSCULAR | Status: AC
  Administered 2014-06-19: 4 mg via INTRAVENOUS
  Filled 2014-06-19: qty 2

## 2014-06-19 MED ORDER — EVICEL 5 ML EX KIT
PACK | CUTANEOUS | Status: DC | PRN
  Administered 2014-06-19: 5 mL via TOPICAL

## 2014-06-19 MED ORDER — HEPARIN SODIUM (PORCINE) 5000 UNIT/ML IJ SOLN
5000.0000 [IU] | Freq: Three times a day (TID) | INTRAMUSCULAR | Status: DC
  Administered 2014-06-20 – 2014-06-25 (×16): 5000 [IU] via SUBCUTANEOUS
  Filled 2014-06-19 (×14): qty 1

## 2014-06-19 MED ORDER — PIPERACILLIN-TAZOBACTAM 3.375 G IVPB
3.3750 g | Freq: Three times a day (TID) | INTRAVENOUS | Status: DC
  Administered 2014-06-19 – 2014-06-23 (×12): 3.375 g via INTRAVENOUS
  Filled 2014-06-19 (×14): qty 50

## 2014-06-19 MED ORDER — ARTIFICIAL TEARS OP OINT
TOPICAL_OINTMENT | OPHTHALMIC | Status: AC
  Filled 2014-06-19: qty 3.5

## 2014-06-19 MED ORDER — SODIUM CHLORIDE 0.9 % IR SOLN
Status: DC | PRN
  Administered 2014-06-19 (×2): 1000 mL

## 2014-06-19 MED ORDER — EPHEDRINE SULFATE 50 MG/ML IJ SOLN
INTRAMUSCULAR | Status: AC
  Filled 2014-06-19: qty 1

## 2014-06-19 MED ORDER — SODIUM CHLORIDE 0.9 % IV SOLN
Freq: Once | INTRAVENOUS | Status: AC
  Administered 2014-06-19: 06:00:00 via INTRAVENOUS

## 2014-06-19 MED ORDER — ARTIFICIAL TEARS OP OINT
TOPICAL_OINTMENT | OPHTHALMIC | Status: DC | PRN
  Administered 2014-06-19: 1 via OPHTHALMIC

## 2014-06-19 SURGICAL SUPPLY — 59 items
APL SKNCLS STERI-STRIP NONHPOA (GAUZE/BANDAGES/DRESSINGS) ×1
BENZOIN TINCTURE PRP APPL 2/3 (GAUZE/BANDAGES/DRESSINGS) ×2 IMPLANT
BLADE SURG ROTATE 9660 (MISCELLANEOUS) ×2 IMPLANT
BNDG ADH 5X2 AIR PERM ELC (GAUZE/BANDAGES/DRESSINGS) ×3
BNDG COHESIVE 2X5 WHT NS (GAUZE/BANDAGES/DRESSINGS) ×6 IMPLANT
CANISTER SUCTION 2500CC (MISCELLANEOUS) ×3 IMPLANT
CATH COUDE FOLEY 2W 5CC 16FR (CATHETERS) ×2 IMPLANT
CHLORAPREP W/TINT 26ML (MISCELLANEOUS) ×3 IMPLANT
CLOSURE WOUND 1/2 X4 (GAUZE/BANDAGES/DRESSINGS) ×1
COVER SURGICAL LIGHT HANDLE (MISCELLANEOUS) ×3 IMPLANT
DECANTER SPIKE VIAL GLASS SM (MISCELLANEOUS) ×4 IMPLANT
DRAIN CHANNEL 19F RND (DRAIN) ×2 IMPLANT
DRAPE LAPAROSCOPIC ABDOMINAL (DRAPES) ×1 IMPLANT
DRAPE WARM FLUID 44X44 (DRAPE) ×3 IMPLANT
ELECT REM PT RETURN 9FT ADLT (ELECTROSURGICAL) ×3
ELECTRODE REM PT RTRN 9FT ADLT (ELECTROSURGICAL) ×1 IMPLANT
EVACUATOR SILICONE 100CC (DRAIN) ×2 IMPLANT
GAUZE SPONGE 4X4 12PLY STRL (GAUZE/BANDAGES/DRESSINGS) ×2 IMPLANT
GLOVE BIO SURGEON STRL SZ 6 (GLOVE) ×4 IMPLANT
GLOVE BIO SURGEON STRL SZ 6.5 (GLOVE) ×1 IMPLANT
GLOVE BIO SURGEON STRL SZ7.5 (GLOVE) ×5 IMPLANT
GLOVE BIO SURGEONS STRL SZ 6.5 (GLOVE) ×1
GLOVE BIOGEL PI IND STRL 6.5 (GLOVE) IMPLANT
GLOVE BIOGEL PI IND STRL 7.0 (GLOVE) IMPLANT
GLOVE BIOGEL PI INDICATOR 6.5 (GLOVE) ×2
GLOVE BIOGEL PI INDICATOR 7.0 (GLOVE) ×4
GLOVE ECLIPSE 6.5 STRL STRAW (GLOVE) ×2 IMPLANT
GOWN STRL REUS W/ TWL LRG LVL3 (GOWN DISPOSABLE) ×3 IMPLANT
GOWN STRL REUS W/ TWL XL LVL3 (GOWN DISPOSABLE) ×1 IMPLANT
GOWN STRL REUS W/TWL LRG LVL3 (GOWN DISPOSABLE) ×9
GOWN STRL REUS W/TWL XL LVL3 (GOWN DISPOSABLE) ×3
KIT BASIN OR (CUSTOM PROCEDURE TRAY) ×3 IMPLANT
KIT ROOM TURNOVER OR (KITS) ×3 IMPLANT
LIQUID BAND (GAUZE/BANDAGES/DRESSINGS) ×1 IMPLANT
NS IRRIG 1000ML POUR BTL (IV SOLUTION) ×3 IMPLANT
PAD ARMBOARD 7.5X6 YLW CONV (MISCELLANEOUS) ×6 IMPLANT
SCALPEL HARMONIC ACE (MISCELLANEOUS) IMPLANT
SCISSORS LAP 5X35 DISP (ENDOMECHANICALS) ×2 IMPLANT
SET IRRIG TUBING LAPAROSCOPIC (IRRIGATION / IRRIGATOR) ×2 IMPLANT
SLEEVE ENDOPATH XCEL 5M (ENDOMECHANICALS) ×3 IMPLANT
SOLUTION ANTI FOG 6CC (MISCELLANEOUS) ×2 IMPLANT
STRIP CLOSURE SKIN 1/2X4 (GAUZE/BANDAGES/DRESSINGS) ×1 IMPLANT
SUT ETHILON 2 0 FS 18 (SUTURE) ×4 IMPLANT
SUT MNCRL AB 4-0 PS2 18 (SUTURE) ×5 IMPLANT
SUT SILK 2 0 SH (SUTURE) ×2 IMPLANT
SUT SILK 3 0 SH 30 (SUTURE) ×6 IMPLANT
SUT VICRYL 0 UR6 27IN ABS (SUTURE) ×2 IMPLANT
SYRINGE TOOMEY DISP (SYRINGE) ×2 IMPLANT
TAPE CLOTH SURG 4X10 WHT LF (GAUZE/BANDAGES/DRESSINGS) ×2 IMPLANT
TIP RIGID 35CM EVICEL (HEMOSTASIS) ×2 IMPLANT
TOWEL OR 17X24 6PK STRL BLUE (TOWEL DISPOSABLE) ×1 IMPLANT
TOWEL OR 17X26 10 PK STRL BLUE (TOWEL DISPOSABLE) ×3 IMPLANT
TRAY FOLEY W/METER SILVER 14FR (SET/KITS/TRAYS/PACK) ×2 IMPLANT
TRAY LAPAROSCOPIC (CUSTOM PROCEDURE TRAY) ×3 IMPLANT
TROCAR XCEL BLUNT TIP 100MML (ENDOMECHANICALS) ×2 IMPLANT
TROCAR XCEL NON-BLD 11X100MML (ENDOMECHANICALS) ×2 IMPLANT
TROCAR XCEL NON-BLD 5MMX100MML (ENDOMECHANICALS) ×3 IMPLANT
TUBING INSUFFLATION (TUBING) ×3 IMPLANT
WATER STERILE IRR 1000ML POUR (IV SOLUTION) IMPLANT

## 2014-06-19 NOTE — H&P (Signed)
Chief Complaint: Abdominal pain HPI: Shaun Santana is a 59 year old obese male presenting with abdominal pain. Duration of symptoms is 2-3 weeks.  Coarse is worsening since about 3 AM.  Location is RUQ.  Characterized as cramping pain.  severe in severity.  Time pattern is constant.  Associated with nausea, constipation.  Denies fever, chills or sweats.  He admits to taking 2 aleve daily for the past 2 weeks.  Denies melena, hematochezia, vomiting.  Last oral intake was yesterday.  He reports GERD like symptoms, but no chest pains, PND, orthopnea or DOE.  Denies significant medical problems.  Reports having a hernia repair in 13-14.  Denies use of anticoagulation.  His work up shows a normal white count, renal function and electrolytes.  CT of A/P shows free air, inflammatory changes around the pylorus and proximal duodenum.  We have therefore been asked to evaluate.   History reviewed. No pertinent past medical history.  Past Surgical History  Procedure Laterality Date  . Hernia repair      History reviewed. No pertinent family history. Social History:  reports that he has never smoked. He does not have any smokeless tobacco history on file. He reports that he drinks alcohol. He reports that he uses illicit drugs (Marijuana).  Allergies: No Known Allergies   (Not in a hospital admission)  Results for orders placed or performed during the hospital encounter of 06/19/14 (from the past 48 hour(s))  CBC with Differential/Platelet     Status: None   Collection Time: 06/19/14  6:01 AM  Result Value Ref Range   WBC 8.3 4.0 - 10.5 K/uL   RBC 5.30 4.22 - 5.81 MIL/uL   Hemoglobin 14.8 13.0 - 17.0 g/dL   HCT 44.6 39.0 - 52.0 %   MCV 84.2 78.0 - 100.0 fL   MCH 27.9 26.0 - 34.0 pg   MCHC 33.2 30.0 - 36.0 g/dL   RDW 13.1 11.5 - 15.5 %   Platelets 229 150 - 400 K/uL   Neutrophils Relative % 48 43 - 77 %   Neutro Abs 4.0 1.7 - 7.7 K/uL   Lymphocytes Relative 42 12 - 46 %   Lymphs Abs 3.5 0.7  - 4.0 K/uL   Monocytes Relative 7 3 - 12 %   Monocytes Absolute 0.6 0.1 - 1.0 K/uL   Eosinophils Relative 2 0 - 5 %   Eosinophils Absolute 0.1 0.0 - 0.7 K/uL   Basophils Relative 1 0 - 1 %   Basophils Absolute 0.0 0.0 - 0.1 K/uL  Comprehensive metabolic panel     Status: Abnormal   Collection Time: 06/19/14  6:01 AM  Result Value Ref Range   Sodium 139 135 - 145 mmol/L   Potassium 3.5 3.5 - 5.1 mmol/L   Chloride 104 101 - 111 mmol/L   CO2 22 22 - 32 mmol/L   Glucose, Bld 190 (H) 65 - 99 mg/dL   BUN 12 6 - 20 mg/dL   Creatinine, Ser 0.88 0.61 - 1.24 mg/dL   Calcium 8.9 8.9 - 10.3 mg/dL   Total Protein 7.1 6.5 - 8.1 g/dL   Albumin 4.1 3.5 - 5.0 g/dL   AST 35 15 - 41 U/L   ALT 40 17 - 63 U/L   Alkaline Phosphatase 75 38 - 126 U/L   Total Bilirubin 0.5 0.3 - 1.2 mg/dL   GFR calc non Af Amer >60 >60 mL/min   GFR calc Af Amer >60 >60 mL/min    Comment: (NOTE)  The eGFR has been calculated using the CKD EPI equation. This calculation has not been validated in all clinical situations. eGFR's persistently <60 mL/min signify possible Chronic Kidney Disease.    Anion gap 13 5 - 15  Lipase, blood     Status: None   Collection Time: 06/19/14  6:01 AM  Result Value Ref Range   Lipase 27 22 - 51 U/L   Ct Abdomen Pelvis Wo Contrast  06/19/2014   CLINICAL DATA:  Right flank pain often on for the past week  EXAM: CT ABDOMEN AND PELVIS WITHOUT CONTRAST  TECHNIQUE: Multidetector CT imaging of the abdomen and pelvis was performed following the standard protocol without IV contrast.  COMPARISON:  05/18/2010  FINDINGS: There is free intraperitoneal air. There is mural thickening and edema around the pylorus and proximal duodenum, and I suspect peptic ulcer disease at this location to be the cause of the free air.  There is no bowel obstruction. There is no abscess. There is no ascites.  The colon appears unremarkable.  There are unremarkable appearances of the liver, spleen, pancreas, adrenals,  kidneys, gallbladder and bile ducts.  There is a noncalcified 4 mm nodule in the posterior left lower lobe, axial image 11 series 205. This may be unchanged from 05/18/2010 but there was mild basilar atelectasis partially obscuring this area on that prior study.  IMPRESSION: Free intraperitoneal air. There are inflammatory changes around the pylorus and proximal duodenum, and this appears to be the most likely source of the free air. These results were called by telephone at the time of interpretation on 06/19/2014 at 6:35 am to Dr. Julianne Rice , who verbally acknowledged these results.  Noncalcified 4 mm left lower lobe pulmonary nodule If the patient is at high risk for bronchogenic carcinoma, follow-up chest CT at 1 year is recommended. If the patient is at low risk, no follow-up is needed. This recommendation follows the consensus statement: Guidelines for Management of Small Pulmonary Nodules Detected on CT Scans: A Statement from the [employer] as published in Radiology 2005; 237:395-400.   Electronically Signed   By: Andreas Newport M.D.   On: 06/19/2014 06:40    Review of Systems  Constitutional: Negative for fever, chills, malaise/fatigue and diaphoresis.  HENT: Negative.   Eyes: Negative for blurred vision, double vision, photophobia, pain and discharge.  Respiratory: Negative.   Cardiovascular: Negative for chest pain, palpitations, orthopnea, claudication, leg swelling and PND.  Gastrointestinal: Positive for heartburn, nausea, abdominal pain and constipation. Negative for vomiting, diarrhea, blood in stool and melena.  Genitourinary: Positive for dysuria. Negative for urgency, frequency and hematuria.  Musculoskeletal: Positive for back pain.  Neurological: Negative.  Negative for weakness.  Psychiatric/Behavioral: Negative.   All other systems reviewed and are negative.   Blood pressure 124/79, pulse 79, temperature 98.3 F (36.8 C), temperature source Oral, resp. rate  16, SpO2 95 %. Physical Exam  Constitutional: He is oriented to person, place, and time. He appears well-developed and well-nourished. He is cooperative. He appears distressed.  HENT:  Head: Normocephalic and atraumatic.  Eyes: Right eye exhibits no discharge. Left eye exhibits no discharge. No scleral icterus.  Cardiovascular: Normal rate, regular rhythm and normal heart sounds.  Exam reveals no friction rub.   No murmur heard. Respiratory: Effort normal and breath sounds normal. No respiratory distress. He has no wheezes. He has no rales. He exhibits no tenderness.  GI: Soft. Bowel sounds are decreased. There is tenderness in the right upper quadrant, epigastric area  and periumbilical area. There is guarding.  Musculoskeletal: Normal range of motion. He exhibits no edema or tenderness.  Neurological: He is alert and oriented to person, place, and time.  Skin: Skin is warm and dry. No rash noted. He is not diaphoretic. No pallor.  Psychiatric: He has a normal mood and affect. His behavior is normal. Judgment and thought content normal.     Assessment/Plan Free air likely from a perforated ulcer -to OR urgently for laparoscopy, possible laparotomy -pain and anti-emetics, no NSAIDs  -check labs in AM, UA is pending -Protonix 38m BID VTE prophylaxis-SCD, post op heparin ID-Zosyn FEN-NPO, IVF Dispo-To OR  [place]lington Sigmund ANP-BC 06/19/2014, 7:48 AM

## 2014-06-19 NOTE — ED Notes (Signed)
No blood cultures at this time per [facility]

## 2014-06-19 NOTE — Anesthesia Postprocedure Evaluation (Signed)
  Anesthesia Post-op Note  Patient: Shaun Santana  Procedure(s) Performed: Procedure(s): LAPAROSCOPIC  REPAIR OF PERFORATED PYLORIC ULCER WITH GRAHAM PATCH (N/A)  Patient Location: PACU  Anesthesia Type: General   Level of Consciousness: awake, alert  and oriented  Airway and Oxygen Therapy: Patient Spontanous Breathing  Post-op Pain: mild  Post-op Assessment: Post-op Vital signs reviewed  Post-op Vital Signs: Reviewed  Last Vitals:  Filed Vitals:   06/19/14 1330  BP: 180/114  Pulse: 89  Temp:   Resp: 10    Complications: No apparent anesthesia complications

## 2014-06-19 NOTE — Transfer of Care (Signed)
Immediate Anesthesia Transfer of Care Note  Patient: Shaun Santana  Procedure(s) Performed: Procedure(s): LAPAROSCOPIC  REPAIR OF PERFORATED PYLORIC ULCER WITH GRAHAM PATCH (N/A)  Patient Location: PACU  Anesthesia Type:General  Level of Consciousness: awake, alert , oriented and patient cooperative  Airway & Oxygen Therapy: Patient Spontanous Breathing and Patient connected to nasal cannula oxygen  Post-op Assessment: Report given to RN, Post -op Vital signs reviewed and stable and Patient moving all extremities X 4  Post vital signs: Reviewed and stable  Last Vitals:  Filed Vitals:   06/19/14 0714  BP: 124/79  Pulse: 79  Temp: 36.8 C  Resp: 16    Complications: No apparent anesthesia complications

## 2014-06-19 NOTE — ED Notes (Signed)
Pt c/o R sided flank pain, tender to palpation. Reports intermittent pain x 1 week, worsening tonight. Reports last bowel movement and flatus was last week. Pt diaphoretic and restless. Pt to CT

## 2014-06-19 NOTE — Progress Notes (Signed)
Anesthesia took pt. to OR before IV fluids could be IV fluids could be scanned

## 2014-06-19 NOTE — ED Notes (Signed)
Patient with history of right flank pain for the last week, off and on.  Patient is diaphoretic and complaining of 10/10 pain that comes in waves.  Patient with nausea with the pain.  Patient having pain in his right lower back that wraps around into flank and groin.

## 2014-06-19 NOTE — ED Notes (Signed)
Yellow belongings sheet sent to tube station 75.  Cell phone and wallet in security.

## 2014-06-19 NOTE — Op Note (Signed)
06/19/2014  11:15 AM  PATIENT:  Luciana Axe  59 y.o. male  PRE-OPERATIVE DIAGNOSIS:  Intraperitoneal free air, perforated ulcer  POST-OPERATIVE DIAGNOSIS:  Intraperitoneal free air, perforated prepyloric ulcer  PROCEDURE:  Procedure(s): LAPAROSCOPIC  PRIMARY REPAIR OF PERFORATED PYLORIC ULCER WITH GRAHAM PATCH (see pictures under media in epic)  SURGEON:  Surgeon(s): Greer Pickerel, MD  ASSISTANTS: Erby Pian, NP   ANESTHESIA:   general  DRAINS: (19) Jackson-Pratt drain(s) with closed bulb suction in the RUQ in GB fossa at perforation   LOCAL MEDICATIONS USED:  MARCAINE     SPECIMEN:  No Specimen  DISPOSITION OF SPECIMEN:  N/A  COUNTS:  YES  INDICATION FOR PROCEDURE: Handsome Anglin is a 59 year old obese male presenting with abdominal pain. Duration of symptoms is 2-3 weeks. Coarse is worsening since about 3 AM. Location is RUQ. Characterized as cramping pain. severe in severity. Time pattern is constant. Associated with nausea, constipation. Denies fever, chills or sweats. He admits to taking 2 aleve daily for the past 2 weeks. Denies melena, hematochezia, vomiting. Last oral intake was yesterday. He reports GERD like symptoms, but no chest pains, PND, orthopnea or DOE. Denies significant medical problems. Reports having a hernia repair in 13-14. Denies use of anticoagulation.  His work up shows a normal white count, renal function and electrolytes. CT of A/P shows free air, inflammatory changes around the pylorus and proximal duodenum.I recommended diagnostic laparoscopy with possible expiratory laparotomy with repair of the perforation with Phillip Heal patch. We discussed risk and benefits of surgery as detailed in my admission note please see that for additional details  PROCEDURE: After obtaining informed consent the patient was taken urgently to operating room one at [facility]. He was placed supine on the operating room table. General endotracheal  anesthesia was established. Sequential compression devices were placed. Nursing staff had some difficulty inserting a Foley catheter. It would not advance. They stopped when they met resistance. I was able to smoothly glided the Foley catheter from the tip of the penis all the way in. There is no resistance. We obtained urine. His abdomen is prepped and draped in usual standard surgical fashion. He received and was receiving broad-spectrum IV anabiotic. A surgical timeout was performed.  Local was infiltrated in the supraumbilical position. Next a small vertical supraumbilical incision was made. The fascia was grasped and lifted anteriorly. The fascia was incised with a #11 blade and the abdominal cavity was entered. A pursestring suture was placed around the fascial edges with 0 Vicryls. A 12 mm hason trocar was inserted and pneumoperitoneum was smoothly established up to a pressure of 15 mmHg. Upon insertion the laparoscope he had thin greenish fluid in his right upper quadrant around his liver probably about 300-500 mL. A 5 mm trochars placed in the left mid abdomen in the right lateral upper quadrant and in the right mid abdomen all under direct visualization after local been infiltrated. We aspirated the fluid from the right upper quadrant before placing the patient reverse Trendelenburg. We lifted the liver up which exposed <1centimeter perforation at or just before the pylorus. There is air coming from the hole. I inspected the rest of the stomach there is no additional evidence of perforation.  I primarily repaired the defect with 3 3-0 silk sutures in a interrupted fashion laparoscopically. I then placed Evicel on top of the closure. I then mobilized a small tongue of omentum taking it down with EndoShears with electrocautery. I placed it over the  repair. Then using a 2-0 silk suture the tongue of omentum was secured over the defect with an interrupted suture. I then placed the remaining Evicel still  over the repair. We aspirated and irrigated the right upper quadrant with 2 L of saline. I then placed a 52 [place] Blake drain in the right upper quadrant in the gallbladder fossa at the site of the repair. The drain was brought out through the right lateral abdominal wall trocar site and secured to the skin with 2-0 nylon sutures. The [facility] trocar was removed and I tied down the pursestring sutures doesn't blurring the fascial defect. I did place 2 additional interrupted 0 Vicryls sutures at the umbilical fascia for additional reinforcement. The closure was viewed laparoscopically. There was no air leak. There is nothing trapped within our closure. Pneumoperitoneum was released remaining trochars removed. Skin incisions were closed with a 4-0 Monocryl in a subcutaneous fashion followed by the application of benzoin, Steri-Strips, and sterile bandages. The patient was extubated and taken to the recovery room in stable condition. Of note anesthesia did have some difficulty in placing a nasogastric tube were eventually able to get it into the stomach. All needle, instrument, and sponge counts were correct 2. There were no immediate complications.  PLAN OF CARE: Admit to inpatient   PATIENT DISPOSITION:  PACU - hemodynamically stable.   Delay start of Pharmacological VTE agent (>24hrs) due to surgical blood loss or risk of bleeding:  no  Leighton Ruff. Redmond Pulling, MD, FACS General, Bariatric, & Minimally Invasive Surgery [facility], [place]

## 2014-06-19 NOTE — Anesthesia Procedure Notes (Addendum)
Procedure Name: Intubation Date/Time: 06/19/2014 9:16 AM Performed by: Roney Mans P Pre-anesthesia Checklist: Patient identified, Timeout performed, Emergency Drugs available, Suction available and Patient being monitored Patient Re-evaluated:Patient Re-evaluated prior to inductionOxygen Delivery Method: Circle system utilized Preoxygenation: Pre-oxygenation with 100% oxygen Intubation Type: Rapid sequence, Cricoid Pressure applied and IV induction Laryngoscope Size: Mac and 4 Grade View: Grade I Tube type: Oral Tube size: 7.5 mm Number of attempts: 1 Airway Equipment and Method: Stylet Placement Confirmation: ETT inserted through vocal cords under direct vision,  breath sounds checked- equal and bilateral and positive ETCO2 Secured at: 22 cm Tube secured with: Tape Dental Injury: Teeth and Oropharynx as per pre-operative assessment  Comments: Bloody posterior oropharynx following NGT insertion.

## 2014-06-19 NOTE — ED Provider Notes (Signed)
CSN: [id number]     Arrival date & time 06/19/14  0548 History   First MD Initiated Contact with Patient 06/19/14 [phone]     Chief Complaint  Patient presents with  . Flank Pain     (Consider location/radiation/quality/duration/timing/severity/associated sxs/prior Treatment) HPI Patient presents with one week of intermittent right-sided abdominal pain that worsened throughout the night. He states the pain is in his right flank and radiates to his right upper and lower abdomen. He said nausea and vomiting but denies any diarrhea. Last bowel movement was yesterday. States his past this evening. No previously similar symptoms prior to one week ago. History reviewed. No pertinent past medical history. Past Surgical History  Procedure Laterality Date  . Hernia repair     History reviewed. No pertinent family history. History  Substance Use Topics  . Smoking status: Never Smoker   . Smokeless tobacco: Not on file  . Alcohol Use: Yes     Comment: 1 pint every weekend    Review of Systems  Constitutional: Negative for fever and chills.  Respiratory: Negative for shortness of breath.   Cardiovascular: Negative for chest pain.  Gastrointestinal: Positive for nausea, vomiting, abdominal pain and abdominal distention. Negative for diarrhea and constipation.  Genitourinary: Negative for dysuria and frequency.  Musculoskeletal: Positive for back pain. Negative for neck pain and neck stiffness.  Skin: Negative for rash and wound.  Neurological: Negative for dizziness, weakness, light-headedness, numbness and headaches.  All other systems reviewed and are negative.     Allergies  Review of patient's allergies indicates no known allergies.  Home Medications   Prior to Admission medications   Not on File   BP 131/69 mmHg  Pulse 79  Temp(Src) 98.5 F (36.9 C) (Oral)  Resp 11  SpO2 96% Physical Exam  Constitutional: He is oriented to person, place, and time. He appears well-developed  and well-nourished. No distress.  Diaphoretic and restless.  HENT:  Head: Normocephalic and atraumatic.  Mouth/Throat: Oropharynx is clear and moist.  Eyes: EOM are normal. Pupils are equal, round, and reactive to light.  Neck: Normal range of motion. Neck supple.  Cardiovascular: Normal rate and regular rhythm.   Pulmonary/Chest: Effort normal and breath sounds normal. No respiratory distress. He has no wheezes. He has no rales.  Abdominal: Soft. Bowel sounds are normal. He exhibits distension. There is tenderness (diffuse tenderness especially in the right upper and lower quadrants.). There is guarding (voluntary guarding). There is no rebound.  Musculoskeletal: Normal range of motion. He exhibits tenderness. He exhibits no edema.  Tenderness to percussion over the right flank area  Neurological: He is alert and oriented to person, place, and time.  Moving all extremities without deficit. Sensation is grossly intact.  Skin: Skin is warm. No rash noted. He is diaphoretic. No erythema.  Psychiatric: He has a normal mood and affect. His behavior is normal.  Nursing note and vitals reviewed.   ED Course  Procedures (including critical care time) Labs Review Labs Reviewed  COMPREHENSIVE METABOLIC PANEL - Abnormal; Notable for the following:    Glucose, Bld 190 (*)    All other components within normal limits  CBC WITH DIFFERENTIAL/PLATELET  LIPASE, BLOOD  URINALYSIS, ROUTINE W REFLEX MICROSCOPIC (NOT AT [facility])  URINALYSIS, ROUTINE W REFLEX MICROSCOPIC (NOT AT [facility])  BASIC METABOLIC PANEL  CBC  H. PYLORI ANTIBODY, IGG    Imaging Review Ct Abdomen Pelvis Wo Contrast  06/19/2014   CLINICAL DATA:  Right flank pain often on for the  past week  EXAM: CT ABDOMEN AND PELVIS WITHOUT CONTRAST  TECHNIQUE: Multidetector CT imaging of the abdomen and pelvis was performed following the standard protocol without IV contrast.  COMPARISON:  05/18/2010  FINDINGS: There is free intraperitoneal air.  There is mural thickening and edema around the pylorus and proximal duodenum, and I suspect peptic ulcer disease at this location to be the cause of the free air.  There is no bowel obstruction. There is no abscess. There is no ascites.  The colon appears unremarkable.  There are unremarkable appearances of the liver, spleen, pancreas, adrenals, kidneys, gallbladder and bile ducts.  There is a noncalcified 4 mm nodule in the posterior left lower lobe, axial image 11 series 205. This may be unchanged from 05/18/2010 but there was mild basilar atelectasis partially obscuring this area on that prior study.  IMPRESSION: Free intraperitoneal air. There are inflammatory changes around the pylorus and proximal duodenum, and this appears to be the most likely source of the free air. These results were called by telephone at the time of interpretation on 06/19/2014 at 6:35 am to Dr. Loren Racer , who verbally acknowledged these results.  Noncalcified 4 mm left lower lobe pulmonary nodule If the patient is at high risk for bronchogenic carcinoma, follow-up chest CT at 1 year is recommended. If the patient is at low risk, no follow-up is needed. This recommendation follows the consensus statement: Guidelines for Management of Small Pulmonary Nodules Detected on CT Scans: A Statement from the [employer] as published in Radiology 2005; 237:395-400.   Electronically Signed   By: Ellery Plunk M.D.   On: 06/19/2014 06:40   Dg Abd Portable 1v  06/19/2014   CLINICAL DATA:  NG tube placement. History of perforated pyloric ulcer.  EXAM: PORTABLE ABDOMEN - 1 VIEW  COMPARISON:  Earlier film, same date  FINDINGS: The NG tube tip is in the fundal region of the stomach. The proximal port is just below the GE junction. A right-sided abdominal drainage catheter is noted.  IMPRESSION: NG tube tip is in the fundal region of the stomach.   Electronically Signed   By: Rudie Meyer M.D.   On: 06/19/2014 14:19   Dg Abd  Portable 1v  06/19/2014   CLINICAL DATA:  NG tube placement  EXAM: PORTABLE ABDOMEN - 1 VIEW  COMPARISON:  CT scan 06/19/2014  FINDINGS: The NG tube tip is in the distal esophagus and needs to be advanced several cm. The bowel gas pattern is unremarkable. The free intraperitoneal air is not obvious on this film.  IMPRESSION: The NG tube tip is in the distal esophagus and needs to be advanced several cm.   Electronically Signed   By: Rudie Meyer M.D.   On: 06/19/2014 12:43     EKG Interpretation None      MDM   Final diagnoses:  Bowel perforation    Patient's feeling much better after initial pain medication. CT with evidence of bowel perforation. Likely site of perforation is in the pylorus or proximal duodenum. Patient states he does suffer from frequent heartburn. Discussed with Dr. Janee Morn who will see the patient in the emergency department. Broad-spectrum antibiotics initiated.   Loren Racer, MD 06/19/14 [phone]

## 2014-06-19 NOTE — ED Notes (Signed)
Patient transported to CT 

## 2014-06-19 NOTE — Anesthesia Preprocedure Evaluation (Addendum)
Anesthesia Evaluation  Patient identified by MRN, date of birth, ID band Patient awake    Reviewed: Allergy & Precautions, NPO status , Patient's Chart, lab work & pertinent test results  Airway Mallampati: II  TM Distance: >3 FB Neck ROM: Full    Dental  (+) Teeth Intact, Dental Advisory Given, Poor Dentition, Loose   Pulmonary neg pulmonary ROS,    Pulmonary exam normal       Cardiovascular negative cardio ROS Normal cardiovascular exam    Neuro/Psych negative neurological ROS  negative psych ROS   GI/Hepatic Neg liver ROS,   Endo/Other  negative endocrine ROS  Renal/GU negative Renal ROS  negative genitourinary   Musculoskeletal negative musculoskeletal ROS (+)   Abdominal   Peds negative pediatric ROS (+)  Hematology negative hematology ROS (+)   Anesthesia Other Findings   Reproductive/Obstetrics negative OB ROS                            Anesthesia Physical Anesthesia Plan  ASA: II and emergent  Anesthesia Plan: General   Post-op Pain Management:    Induction: Intravenous  Airway Management Planned: Oral ETT  Additional Equipment:   Intra-op Plan:   Post-operative Plan: Extubation in OR  Informed Consent: I have reviewed the patients History and Physical, chart, labs and discussed the procedure including the risks, benefits and alternatives for the proposed anesthesia with the patient or authorized representative who has indicated his/her understanding and acceptance.   Dental advisory given  Plan Discussed with: Anesthesiologist, CRNA and Surgeon  Anesthesia Plan Comments:         Anesthesia Quick Evaluation

## 2014-06-20 DIAGNOSIS — R911 Solitary pulmonary nodule: Secondary | ICD-10-CM | POA: Diagnosis present

## 2014-06-20 DIAGNOSIS — K275 Chronic or unspecified peptic ulcer, site unspecified, with perforation: Secondary | ICD-10-CM | POA: Diagnosis present

## 2014-06-20 LAB — BASIC METABOLIC PANEL
Anion gap: 10 (ref 5–15)
BUN: 11 mg/dL (ref 6–20)
CO2: 25 mmol/L (ref 22–32)
Calcium: 8.4 mg/dL — ABNORMAL LOW (ref 8.9–10.3)
Chloride: 105 mmol/L (ref 101–111)
Creatinine, Ser: 0.88 mg/dL (ref 0.61–1.24)
GFR calc Af Amer: >60 mL/min (ref >60)
GFR calc non Af Amer: >60 mL/min (ref >60)
Glucose, Bld: 138 mg/dL — ABNORMAL HIGH (ref 65–99)
Potassium: 3.7 mmol/L (ref 3.5–5.1)
Sodium: 140 mmol/L (ref 135–145)

## 2014-06-20 LAB — CBC
HCT: 38.8 % — ABNORMAL LOW (ref 39.0–52.0)
Hemoglobin: 12.5 g/dL — ABNORMAL LOW (ref 13.0–17.0)
MCH: 27.7 pg (ref 26.0–34.0)
MCHC: 32.2 g/dL (ref 30.0–36.0)
MCV: 86 fL (ref 78.0–100.0)
Platelets: 185 10*3/uL (ref 150–400)
RBC: 4.51 MIL/uL (ref 4.22–5.81)
RDW: 13.6 % (ref 11.5–15.5)
WBC: 8.5 10*3/uL (ref 4.0–10.5)

## 2014-06-20 NOTE — Progress Notes (Signed)
1 Day Post-Op  Subjective: Didn't sleep well. Pain control ok. No nausea.   Objective: Vital signs in last 24 hours: Temp:  [97.6 F (36.4 C)-98.6 F (37 C)] 97.9 F (36.6 C) (06/11 0543) Pulse Rate:  [67-89] 67 (06/11 0543) Resp:  [8-21] 12 (06/11 0543) BP: (125-188)/(62-120) 132/69 mmHg (06/11 0543) SpO2:  [93 %-100 %] 96 % (06/11 0543)    Intake/Output from previous day: 06/10 0701 - 06/11 0700 In: 4168.3 [I.V.:3758.3; NG/GT:60; IV Piggyback:350] Out: 2715 [Urine:2475; Emesis/NG output:150; Drains:90] Intake/Output this shift:    Alert, nontoxic cta b/l; IS about 1700 Reg Obese, soft, mild ttp; dressing c/d/i; some shadowing on drain sponge. Drain -serosang.  NG - about 200 bilious +SCDs  Lab Results:   Recent Labs  06/19/14 0601 06/20/14 0354  WBC 8.3 8.5  HGB 14.8 12.5*  HCT 44.6 38.8*  PLT 229 185   BMET  Recent Labs  06/19/14 0601 06/20/14 0354  NA 139 140  K 3.5 3.7  CL 104 105  CO2 22 25  GLUCOSE 190* 138*  BUN 12 11  CREATININE 0.88 0.88  CALCIUM 8.9 8.4*   PT/INR No results for input(s): LABPROT, INR in the last 72 hours. ABG No results for input(s): PHART, HCO3 in the last 72 hours.  Invalid input(s): PCO2, PO2  Studies/Results: Ct Abdomen Pelvis Wo Contrast  06/19/2014   CLINICAL DATA:  Right flank pain often on for the past week  EXAM: CT ABDOMEN AND PELVIS WITHOUT CONTRAST  TECHNIQUE: Multidetector CT imaging of the abdomen and pelvis was performed following the standard protocol without IV contrast.  COMPARISON:  05/18/2010  FINDINGS: There is free intraperitoneal air. There is mural thickening and edema around the pylorus and proximal duodenum, and I suspect peptic ulcer disease at this location to be the cause of the free air.  There is no bowel obstruction. There is no abscess. There is no ascites.  The colon appears unremarkable.  There are unremarkable appearances of the liver, spleen, pancreas, adrenals, kidneys, gallbladder and  bile ducts.  There is a noncalcified 4 mm nodule in the posterior left lower lobe, axial image 11 series 205. This may be unchanged from 05/18/2010 but there was mild basilar atelectasis partially obscuring this area on that prior study.  IMPRESSION: Free intraperitoneal air. There are inflammatory changes around the pylorus and proximal duodenum, and this appears to be the most likely source of the free air. These results were called by telephone at the time of interpretation on 06/19/2014 at 6:35 am to Dr. Loren Racer , who verbally acknowledged these results.  Noncalcified 4 mm left lower lobe pulmonary nodule If the patient is at high risk for bronchogenic carcinoma, follow-up chest CT at 1 year is recommended. If the patient is at low risk, no follow-up is needed. This recommendation follows the consensus statement: Guidelines for Management of Small Pulmonary Nodules Detected on CT Scans: A Statement from the Fleischner Society as published in Radiology 2005; 237:395-400.   Electronically Signed   By: Ellery Plunk M.D.   On: 06/19/2014 06:40   Dg Abd Portable 1v  06/19/2014   CLINICAL DATA:  NG tube placement. History of perforated pyloric ulcer.  EXAM: PORTABLE ABDOMEN - 1 VIEW  COMPARISON:  Earlier film, same date  FINDINGS: The NG tube tip is in the fundal region of the stomach. The proximal port is just below the GE junction. A right-sided abdominal drainage catheter is noted.  IMPRESSION: NG tube tip is in the  fundal region of the stomach.   Electronically Signed   By: Rudie Meyer M.D.   On: 06/19/2014 14:19   Dg Abd Portable 1v  06/19/2014   CLINICAL DATA:  NG tube placement  EXAM: PORTABLE ABDOMEN - 1 VIEW  COMPARISON:  CT scan 06/19/2014  FINDINGS: The NG tube tip is in the distal esophagus and needs to be advanced several cm. The bowel gas pattern is unremarkable. The free intraperitoneal air is not obvious on this film.  IMPRESSION: The NG tube tip is in the distal esophagus and needs  to be advanced several cm.   Electronically Signed   By: Rudie Meyer M.D.   On: 06/19/2014 12:43    Anti-infectives: Anti-infectives    Start     Dose/Rate Route Frequency Ordered Stop   06/19/14 1500  piperacillin-tazobactam (ZOSYN) IVPB 3.375 g     3.375 g 12.5 mL/hr over 240 Minutes Intravenous 3 times per day 06/19/14 1119     06/19/14 0645  piperacillin-tazobactam (ZOSYN) IVPB 3.375 g     3.375 g 100 mL/hr over 30 Minutes Intravenous  Once 06/19/14 0636 06/19/14 0803   06/19/14 0645  metroNIDAZOLE (FLAGYL) IVPB 500 mg     500 mg 100 mL/hr over 60 Minutes Intravenous  Once 06/19/14 0636 06/19/14 0906      Assessment/Plan: s/p Procedure(s): LAPAROSCOPIC  REPAIR OF PERFORATED PYLORIC ULCER WITH GRAHAM PATCH (N/A) Cont IV abx Day 2/7 Cont IV bid protonix Cont bowel rest, ng tube decompression; may have sham ice chips/sips of water Will plan UGI next week prior to eating (see below) Discussed intraop findings & typical postop course F/u h pylori Ab test  GERD - pt relates what sounds like prior h/o ulcers and significant GERD. Anesthesia did have difficulty in passing NG. Will get UGI next week to evaluate for stricture/anatomy/evaluate repair  BPH - had some minor difficulty with foley insertion in OR. Also reports significant BPH symptoms. Leave foley in today  LLL pulmonary nodule - need outpt followup.  VTE prophylaxis - scds, subcu heparin  Pulm - Is, ambulate, OOB  Mary Sella. Andrey Campanile, MD, FACS General, Bariatric, & Minimally Invasive Surgery [facility], [place]   LOS: 1 day    Shaun Santana 06/20/2014

## 2014-06-21 LAB — CBC
HEMATOCRIT: 35.9 % — AB (ref 39.0–52.0)
Hemoglobin: 11.4 g/dL — ABNORMAL LOW (ref 13.0–17.0)
MCH: 27.9 pg (ref 26.0–34.0)
MCHC: 31.8 g/dL (ref 30.0–36.0)
MCV: 87.8 fL (ref 78.0–100.0)
PLATELETS: 167 10*3/uL (ref 150–400)
RBC: 4.09 MIL/uL — ABNORMAL LOW (ref 4.22–5.81)
RDW: 13.7 % (ref 11.5–15.5)
WBC: 6.6 10*3/uL (ref 4.0–10.5)

## 2014-06-21 LAB — BASIC METABOLIC PANEL
Anion gap: 8 (ref 5–15)
BUN: 14 mg/dL (ref 6–20)
CHLORIDE: 105 mmol/L (ref 101–111)
CO2: 27 mmol/L (ref 22–32)
CREATININE: 1.01 mg/dL (ref 0.61–1.24)
Calcium: 8.5 mg/dL — ABNORMAL LOW (ref 8.9–10.3)
GFR calc non Af Amer: >60 mL/min (ref >60)
Glucose, Bld: 122 mg/dL — ABNORMAL HIGH (ref 65–99)
Potassium: 3.6 mmol/L (ref 3.5–5.1)
Sodium: 140 mmol/L (ref 135–145)

## 2014-06-21 LAB — MAGNESIUM: Magnesium: 2.1 mg/dL (ref 1.7–2.4)

## 2014-06-21 MED ORDER — TAMSULOSIN HCL 0.4 MG PO CAPS
0.4000 mg | ORAL_CAPSULE | Freq: Every day | ORAL | Status: DC
  Administered 2014-06-21 – 2014-06-24 (×4): 0.4 mg via ORAL
  Filled 2014-06-21 (×4): qty 1

## 2014-06-21 NOTE — Progress Notes (Signed)
2 Days Post-Op  Subjective: Walked in halls. Did IS. Up to 1700. +flatus. Not much pain - just around drain.   Objective: Vital signs in last 24 hours: Temp:  [98 F (36.7 C)-98.6 F (37 C)] 98.5 F (36.9 C) (06/12 0555) Pulse Rate:  [51-63] 51 (06/12 0555) Resp:  [18-20] 20 (06/12 0555) BP: (120-130)/(55-75) 128/68 mmHg (06/12 0555) SpO2:  [94 %-95 %] 95 % (06/12 0555) Weight:  [108.863 kg (240 lb)] 108.863 kg (240 lb) (06/12 0125) Last BM Date: 06/18/14  Intake/Output from previous day: 06/11 0701 - 06/12 0700 In: 5420 [P.O.:80; I.V.:3650; NG/GT:440; IV Piggyback:150] Out: 1415 [Urine:500; Emesis/NG output:850; Drains:65] Intake/Output this shift:    Asleep, easily awakens cta b/l Reg Obese, soft, dressing c/d/i; protuberant abd (baseline), hypoBS, drain - serous  Lab Results:   Recent Labs  06/20/14 0354 06/21/14 0401  WBC 8.5 6.6  HGB 12.5* 11.4*  HCT 38.8* 35.9*  PLT 185 167   BMET  Recent Labs  06/20/14 0354 06/21/14 0401  NA 140 140  K 3.7 3.6  CL 105 105  CO2 25 27  GLUCOSE 138* 122*  BUN 11 14  CREATININE 0.88 1.01  CALCIUM 8.4* 8.5*   PT/INR No results for input(s): LABPROT, INR in the last 72 hours. ABG No results for input(s): PHART, HCO3 in the last 72 hours.  Invalid input(s): PCO2, PO2  Studies/Results: Dg Abd Portable 1v  06/19/2014   CLINICAL DATA:  NG tube placement. History of perforated pyloric ulcer.  EXAM: PORTABLE ABDOMEN - 1 VIEW  COMPARISON:  Earlier film, same date  FINDINGS: The NG tube tip is in the fundal region of the stomach. The proximal port is just below the GE junction. A right-sided abdominal drainage catheter is noted.  IMPRESSION: NG tube tip is in the fundal region of the stomach.   Electronically Signed   By: Rudie Meyer M.D.   On: 06/19/2014 14:19   Dg Abd Portable 1v  06/19/2014   CLINICAL DATA:  NG tube placement  EXAM: PORTABLE ABDOMEN - 1 VIEW  COMPARISON:  CT scan 06/19/2014  FINDINGS: The NG tube tip  is in the distal esophagus and needs to be advanced several cm. The bowel gas pattern is unremarkable. The free intraperitoneal air is not obvious on this film.  IMPRESSION: The NG tube tip is in the distal esophagus and needs to be advanced several cm.   Electronically Signed   By: Rudie Meyer M.D.   On: 06/19/2014 12:43    Anti-infectives: Anti-infectives    Start     Dose/Rate Route Frequency Ordered Stop   06/19/14 1500  piperacillin-tazobactam (ZOSYN) IVPB 3.375 g     3.375 g 12.5 mL/hr over 240 Minutes Intravenous 3 times per day 06/19/14 1119     06/19/14 0645  piperacillin-tazobactam (ZOSYN) IVPB 3.375 g     3.375 g 100 mL/hr over 30 Minutes Intravenous  Once 06/19/14 0636 06/19/14 0803   06/19/14 0645  metroNIDAZOLE (FLAGYL) IVPB 500 mg     500 mg 100 mL/hr over 60 Minutes Intravenous  Once 06/19/14 0636 06/19/14 0906      Assessment/Plan: s/p Procedure(s): LAPAROSCOPIC  REPAIR OF PERFORATED PYLORIC ULCER WITH GRAHAM PATCH (N/A) Cont IV abx Day 3/7 Cont IV bid protonix Cont bowel rest, ng tube decompression; may have sham ice chips/sips of water. Majority of NG output in cannister is clear Will plan UGI next week prior to eating (see below) F/u h pylori Ab test  GERD -  pt relates what sounds like prior h/o ulcers and significant GERD. Anesthesia did have difficulty in passing NG. Will get UGI next week to evaluate for stricture/anatomy/evaluate repair  BPH - had some minor difficulty with foley insertion in OR. Also reports significant BPH symptoms. Leave foley in today. Start flomax. Voiding trial Monday am  LLL pulmonary nodule - need outpt followup. Discussed with pt VTE prophylaxis - scds, subcu heparin  Pulm - Is, ambulate, OOB  Mary Sella. Andrey Campanile, MD, FACS General, Bariatric, & Minimally Invasive Surgery [facility], [place]   LOS: 2 days    Atilano Ina 06/21/2014

## 2014-06-22 ENCOUNTER — Inpatient Hospital Stay (HOSPITAL_COMMUNITY): Payer: BC Managed Care – PPO

## 2014-06-22 ENCOUNTER — Encounter (HOSPITAL_COMMUNITY): Payer: Self-pay | Admitting: General Surgery

## 2014-06-22 LAB — H. PYLORI ANTIBODY, IGG: H PYLORI IGG: 6 U/mL — AB (ref 0.0–0.8)

## 2014-06-22 MED ORDER — IOHEXOL 300 MG/ML  SOLN
300.0000 mL | Freq: Once | INTRAMUSCULAR | Status: AC | PRN
  Administered 2014-06-22: 150 mL via INTRAVENOUS

## 2014-06-22 NOTE — Progress Notes (Signed)
[facility] Progress Note  3 Days Post-Op  Subjective: Pt doing okay, complains of pain at incisions sites and drain site in right abdomen.   He says the drain isn't putting out much.  Denies N/V, having flatus, no BM.  Thirsty.  Ambulating in the halls well.  Ready to have foley out.  Radiology up to get him shortly.  Objective: Vital signs in last 24 hours: Temp:  [98.3 F (36.8 C)-98.4 F (36.9 C)] 98.3 F (36.8 C) (06/13 0536) Pulse Rate:  [53-85] 61 (06/13 0536) Resp:  [16-18] 18 (06/13 0536) BP: (117-127)/(55-73) 117/55 mmHg (06/13 0536) SpO2:  [93 %] 93 % (06/13 0536) Last BM Date: 06/18/14  Intake/Output from previous day: 06/12 0701 - 06/13 0700 In: 3078.3 [P.O.:30; I.V.:2758.3; NG/GT:30; IV Piggyback:200] Out: 1960 [Urine:1875; Emesis/NG output:50; Drains:35] Intake/Output this shift:    PE: Gen:  Alert, NAD, pleasant Abd: Soft, distended, tender over incisions and right abdominal drain, +BS, no HSM, incisions C/D/I, drain with minimal serous drainage   Lab Results:   Recent Labs  06/20/14 0354 06/21/14 0401  WBC 8.5 6.6  HGB 12.5* 11.4*  HCT 38.8* 35.9*  PLT 185 167   BMET  Recent Labs  06/20/14 0354 06/21/14 0401  NA 140 140  K 3.7 3.6  CL 105 105  CO2 25 27  GLUCOSE 138* 122*  BUN 11 14  CREATININE 0.88 1.01  CALCIUM 8.4* 8.5*   PT/INR No results for input(s): LABPROT, INR in the last 72 hours. CMP     Component Value Date/Time   NA 140 06/21/2014 0401   K 3.6 06/21/2014 0401   CL 105 06/21/2014 0401   CO2 27 06/21/2014 0401   GLUCOSE 122* 06/21/2014 0401   BUN 14 06/21/2014 0401   CREATININE 1.01 06/21/2014 0401   CALCIUM 8.5* 06/21/2014 0401   PROT 7.1 06/19/2014 0601   ALBUMIN 4.1 06/19/2014 0601   AST 35 06/19/2014 0601   ALT 40 06/19/2014 0601   ALKPHOS 75 06/19/2014 0601   BILITOT 0.5 06/19/2014 0601   GFRNONAA >60 06/21/2014 0401   GFRAA >60 06/21/2014 0401   Lipase     Component Value Date/Time   LIPASE 27 06/19/2014 0601       Studies/Results: No results found.  Anti-infectives: Anti-infectives    Start     Dose/Rate Route Frequency Ordered Stop   06/19/14 1500  piperacillin-tazobactam (ZOSYN) IVPB 3.375 g     3.375 g 12.5 mL/hr over 240 Minutes Intravenous 3 times per day 06/19/14 1119     06/19/14 0645  piperacillin-tazobactam (ZOSYN) IVPB 3.375 g     3.375 g 100 mL/hr over 30 Minutes Intravenous  Once 06/19/14 0636 06/19/14 0803   06/19/14 0645  metroNIDAZOLE (FLAGYL) IVPB 500 mg     500 mg 100 mL/hr over 60 Minutes Intravenous  Once 06/19/14 0636 06/19/14 0906       Assessment/Plan Intraperitoneal free air & Perforated prepyloric ulcer  POD #3 s/p LAPAROSCOPIC PRIMARY REPAIR OF PERFORATED PYLORIC ULCER WITH GRAHAM PATCH (EW 06/19/14) -Good urine output, Cr. And BUN normal, D/c foley now -Ambulate and IS -SCD's and heparin -Zosyn Day #4/7 -BID IV protonix -Bowel rest, NG, ice chips/sips water -H.pylori Ab test is still pending -F/u with Dr. Andrey Campanile at discharge -NG output low so proceed with UGI today to evaluate for stricture/anatomy/evaluate repair as per Dr. Tawana Scale request  GERD -Pt relates what sounds like prior h/o ulcers and significant GERD. Anesthesia did have difficulty in passing NG.  -  UGI pending BPH  -Had some minor difficulty with foley insertion in OR. Also reports significant BPH symptoms. Cont flomax. Voiding trial today LLL pulmonary nodule  -Need outpt followup. Dr. Andrey Campanile discussed with patient. VTE prophylaxis  -Scds, subq heparin     LOS: 3 days    Nonie Hoyer 06/22/2014, 8:25 AM Pager: [phone]

## 2014-06-23 MED ORDER — PIPERACILLIN-TAZOBACTAM 3.375 G IVPB
3.3750 g | Freq: Three times a day (TID) | INTRAVENOUS | Status: DC
  Administered 2014-06-23 – 2014-06-24 (×3): 3.375 g via INTRAVENOUS
  Filled 2014-06-23 (×5): qty 50

## 2014-06-23 MED ORDER — CLARITHROMYCIN 500 MG PO TABS
500.0000 mg | ORAL_TABLET | Freq: Two times a day (BID) | ORAL | Status: DC
  Filled 2014-06-23 (×2): qty 1

## 2014-06-23 MED ORDER — AMOXICILLIN 500 MG PO CAPS
1000.0000 mg | ORAL_CAPSULE | Freq: Two times a day (BID) | ORAL | Status: DC
  Filled 2014-06-23 (×2): qty 2

## 2014-06-23 NOTE — Plan of Care (Signed)
Problem: Food- and Nutrition-Related Knowledge Deficit (NB-1.1) Goal: Nutrition education Formal process to instruct or train a patient/client in a skill or to impart knowledge to help patients/clients voluntarily manage or modify food choices and eating behavior to maintain or improve health. Outcome: Adequate for Discharge  RD consulted for nutrition education regarding GERD/PUD.   No results found for: HGBA1C  RD provided "GERD Nutrition Therapy" handout from the Academy of [employer]. Discussed different food groups and their potential for aggravating GERD. Provided list of common trigger foods. Discussed avoidance of trigger foods. Suggested pt to keep food diary to help him identify trigger foods. Also discussed avoidance of fatty, highly seasoned foods. Encouraged consumption of lean proteins. Teach back method used.  Expect fair to good compliance.  Body mass index is 33.49 kg/(m^2). Pt meets criteria for obesity, class I based on current BMI.  Current diet order is clear liquid, patient is consuming approximately 100% of meals at this time. Labs and medications reviewed. No further nutrition interventions warranted at this time. RD contact information provided. If additional nutrition issues arise, please re-consult RD.  Shaun Santana A. Mayford Knife, RD, LDN, CDE Pager: [phone] After hours Pager: [phone]

## 2014-06-23 NOTE — Progress Notes (Signed)
[facility] Progress Note  4 Days Post-Op  Subjective: Pt doing okay, some pain, but well controlled, he's hungry/thirsty.  Ambulating OOB well.  Having flatus and BM's, urinating well on own.  Drain is clear with 10mL output.  Objective: Vital signs in last 24 hours: Temp:  [98.2 F (36.8 C)-98.8 F (37.1 C)] 98.5 F (36.9 C) (06/14 0606) Pulse Rate:  [51-57] 51 (06/14 0606) Resp:  [17-18] 18 (06/14 0606) BP: (124-134)/(55-79) 126/55 mmHg (06/14 0606) SpO2:  [96 %-98 %] 98 % (06/14 0606) Last BM Date: 06/22/14  Intake/Output from previous day: 06/13 0701 - 06/14 0700 In: 2104.6 [P.O.:60; I.V.:1864.6; NG/GT:30; IV Piggyback:150] Out: 1385 [Urine:1275; Emesis/NG output:100; Drains:10] Intake/Output this shift:    PE: Gen:  Alert, NAD, pleasant Card:  RRR, no M/G/R heard Pulm:  CTA, no W/R/R, good effort Abd: Soft, mild distension, tenderness over incision and drain sites, +BS, no HSM, incisions C/D/I, drain with minimal serous drainage   Lab Results:   Recent Labs  06/21/14 0401  WBC 6.6  HGB 11.4*  HCT 35.9*  PLT 167   BMET  Recent Labs  06/21/14 0401  NA 140  K 3.6  CL 105  CO2 27  GLUCOSE 122*  BUN 14  CREATININE 1.01  CALCIUM 8.5*   PT/INR No results for input(s): LABPROT, INR in the last 72 hours. CMP     Component Value Date/Time   NA 140 06/21/2014 0401   K 3.6 06/21/2014 0401   CL 105 06/21/2014 0401   CO2 27 06/21/2014 0401   GLUCOSE 122* 06/21/2014 0401   BUN 14 06/21/2014 0401   CREATININE 1.01 06/21/2014 0401   CALCIUM 8.5* 06/21/2014 0401   PROT 7.1 06/19/2014 0601   ALBUMIN 4.1 06/19/2014 0601   AST 35 06/19/2014 0601   ALT 40 06/19/2014 0601   ALKPHOS 75 06/19/2014 0601   BILITOT 0.5 06/19/2014 0601   GFRNONAA >60 06/21/2014 0401   GFRAA >60 06/21/2014 0401   Lipase     Component Value Date/Time   LIPASE 27 06/19/2014 0601       Studies/Results: Dg Ugi W/water Sol Cm  06/22/2014   CLINICAL DATA:   Perforated pyloric channel ulcer  EXAM: WATER SOLUBLE UPPER GI SERIES WITH KUB  TECHNIQUE: Single-column upper GI series was performed using water soluble contrast.  CONTRAST:  150 cc Omnipaque 300 via NG tube  COMPARISON:  CT scan dated 06/19/2014  FLUOROSCOPY TIME:  Fluoroscopy Time : 1 min 54 seconds  FINDINGS: Surgical drain in place in the upper abdomen. NG tube tip in the fundus of the stomach. Normal bowel gas pattern.  Contrast was injected in the NG tube with the patient in the right lateral decubitus position. There is flow of the contrast through the pylorus into the duodenal bulb and C-loop with no visible extravasation. There is mucosal edema in proximal duodenum and pylorus. Stomach appears normal.  IMPRESSION: No evidence of contrast extravasation during water-soluble upper GI. Edema of the proximal duodenum and pylorus.   Electronically Signed   By: Francene Boyers M.D.   On: 06/22/2014 10:20    Anti-infectives: Anti-infectives    Start     Dose/Rate Route Frequency Ordered Stop   06/19/14 1500  piperacillin-tazobactam (ZOSYN) IVPB 3.375 g     3.375 g 12.5 mL/hr over 240 Minutes Intravenous 3 times per day 06/19/14 1119     06/19/14 0645  piperacillin-tazobactam (ZOSYN) IVPB 3.375 g     3.375 g 100 mL/hr over 30  Minutes Intravenous  Once 06/19/14 0636 06/19/14 0803   06/19/14 0645  metroNIDAZOLE (FLAGYL) IVPB 500 mg     500 mg 100 mL/hr over 60 Minutes Intravenous  Once 06/19/14 0636 06/19/14 0906       Assessment/Plan Intraperitoneal free air & Perforated prepyloric ulcer  POD #4 s/p LAPAROSCOPIC PRIMARY REPAIR OF PERFORATED PYLORIC ULCER WITH GRAHAM PATCH (EW 06/19/14) -foley discontinued, urinating well on own -Ambulate and IS -SCD's and heparin -Day #5/5 Zosyn, will d/c tomorrow when taking better PO and start on "prev-pack" for h.pyolori -BID IV protonix -Dietitian for GERD/PUD education -NG d/c yesterday, start clears -F/u with Dr. Andrey Campanile at discharge -UGI without  leak, edema to duodenum and pylorus, but contrast flows  GERD/PUD/H.pylori infection -Pt relates what sounds like prior h/o ulcers and significant GERD. Anesthesia did have difficulty in passing NG.  -H.pylori Ab test positive, start biaxin and amoxicillin when taking better PO and continue protonix for 2-3 weeks BPH  -Had some minor difficulty with foley insertion in OR. Also reports significant BPH symptoms. Cont flomax.  -Voiding on own LLL pulmonary nodule  -Need outpt followup. Dr. Andrey Campanile discussed with patient. VTE prophylaxis  -Scds, subq heparin     LOS: 4 days    Nonie Hoyer 06/23/2014, 8:30 AM Pager: [phone]

## 2014-06-24 LAB — CBC
HCT: 41 % (ref 39.0–52.0)
Hemoglobin: 13.9 g/dL (ref 13.0–17.0)
MCH: 28.5 pg (ref 26.0–34.0)
MCHC: 33.9 g/dL (ref 30.0–36.0)
MCV: 84 fL (ref 78.0–100.0)
PLATELETS: 216 10*3/uL (ref 150–400)
RBC: 4.88 MIL/uL (ref 4.22–5.81)
RDW: 12.9 % (ref 11.5–15.5)
WBC: 5 10*3/uL (ref 4.0–10.5)

## 2014-06-24 LAB — BASIC METABOLIC PANEL
ANION GAP: 9 (ref 5–15)
BUN: 6 mg/dL (ref 6–20)
CALCIUM: 9.3 mg/dL (ref 8.9–10.3)
CHLORIDE: 104 mmol/L (ref 101–111)
CO2: 24 mmol/L (ref 22–32)
Creatinine, Ser: 1 mg/dL (ref 0.61–1.24)
GFR calc non Af Amer: >60 mL/min (ref >60)
Glucose, Bld: 173 mg/dL — ABNORMAL HIGH (ref 65–99)
Potassium: 4.1 mmol/L (ref 3.5–5.1)
Sodium: 137 mmol/L (ref 135–145)

## 2014-06-24 MED ORDER — AMOXICILLIN 500 MG PO CAPS
1000.0000 mg | ORAL_CAPSULE | Freq: Two times a day (BID) | ORAL | Status: DC
  Administered 2014-06-24 – 2014-06-25 (×3): 1000 mg via ORAL
  Filled 2014-06-24 (×5): qty 2

## 2014-06-24 MED ORDER — PANTOPRAZOLE SODIUM 40 MG PO TBEC
40.0000 mg | DELAYED_RELEASE_TABLET | Freq: Two times a day (BID) | ORAL | Status: DC
Start: 2014-06-24 — End: 2014-06-25
  Administered 2014-06-24 – 2014-06-25 (×2): 40 mg via ORAL
  Filled 2014-06-24 (×2): qty 1

## 2014-06-24 MED ORDER — HYDROMORPHONE HCL 1 MG/ML IJ SOLN: 1.0000 mg | INTRAMUSCULAR | Status: DC | PRN

## 2014-06-24 MED ORDER — DOCUSATE SODIUM 100 MG PO CAPS
100.0000 mg | ORAL_CAPSULE | Freq: Two times a day (BID) | ORAL | Status: DC
  Administered 2014-06-24 – 2014-06-25 (×3): 100 mg via ORAL
  Filled 2014-06-24 (×3): qty 1

## 2014-06-24 MED ORDER — HYDROCODONE-ACETAMINOPHEN 5-325 MG PO TABS
1.0000 | ORAL_TABLET | ORAL | Status: DC | PRN
  Administered 2014-06-24 – 2014-06-25 (×2): 2 via ORAL
  Filled 2014-06-24 (×2): qty 2

## 2014-06-24 MED ORDER — BISACODYL 10 MG RE SUPP: 10.0000 mg | Freq: Every day | RECTAL | Status: DC | PRN

## 2014-06-24 MED ORDER — POLYETHYLENE GLYCOL 3350 17 G PO PACK: 17.0000 g | PACK | Freq: Every day | ORAL | Status: DC | PRN

## 2014-06-24 MED ORDER — CLARITHROMYCIN 500 MG PO TABS
500.0000 mg | ORAL_TABLET | Freq: Two times a day (BID) | ORAL | Status: DC
  Administered 2014-06-24 – 2014-06-25 (×3): 500 mg via ORAL
  Filled 2014-06-24 (×5): qty 1

## 2014-06-24 NOTE — Progress Notes (Signed)
[facility] Progress Note  5 Days Post-Op  Subjective: Pt doing well, no N/V, thirsty/hungry.  Ambulating well.  Dietitian saw him yesterday.  Having flatus and had a BM but says it was black.  Using IS well.    Objective: Vital signs in last 24 hours: Temp:  [97.8 F (36.6 C)-99.3 F (37.4 C)] 97.8 F (36.6 C) (06/15 0528) Pulse Rate:  [48-52] 48 (06/15 0528) Resp:  [18-20] 18 (06/15 0528) BP: (115-135)/(66-72) 115/72 mmHg (06/15 0528) SpO2:  [96 %-98 %] 97 % (06/15 0528) Last BM Date: 06/23/14  Intake/Output from previous day: 06/14 0701 - 06/15 0700 In: 2560 [P.O.:720; I.V.:1690; IV Piggyback:150] Out: 308 [Urine:300; Drains:8] Intake/Output this shift:    PE: Gen:  Alert, NAD, pleasant Abd: Soft, mild distension, tenderness over RLQ drain which has minimal serous output, +BS, no HSM, incisions C/D/I (will remove band-aids today)   Lab Results:  No results for input(s): WBC, HGB, HCT, PLT in the last 72 hours. BMET No results for input(s): NA, K, CL, CO2, GLUCOSE, BUN, CREATININE, CALCIUM in the last 72 hours. PT/INR No results for input(s): LABPROT, INR in the last 72 hours. CMP     Component Value Date/Time   NA 140 06/21/2014 0401   K 3.6 06/21/2014 0401   CL 105 06/21/2014 0401   CO2 27 06/21/2014 0401   GLUCOSE 122* 06/21/2014 0401   BUN 14 06/21/2014 0401   CREATININE 1.01 06/21/2014 0401   CALCIUM 8.5* 06/21/2014 0401   PROT 7.1 06/19/2014 0601   ALBUMIN 4.1 06/19/2014 0601   AST 35 06/19/2014 0601   ALT 40 06/19/2014 0601   ALKPHOS 75 06/19/2014 0601   BILITOT 0.5 06/19/2014 0601   GFRNONAA >60 06/21/2014 0401   GFRAA >60 06/21/2014 0401   Lipase     Component Value Date/Time   LIPASE 27 06/19/2014 0601       Studies/Results: Dg Ugi W/water Sol Cm  06/22/2014   CLINICAL DATA:  Perforated pyloric channel ulcer  EXAM: WATER SOLUBLE UPPER GI SERIES WITH KUB  TECHNIQUE: Single-column upper GI series was performed using water  soluble contrast.  CONTRAST:  150 cc Omnipaque 300 via NG tube  COMPARISON:  CT scan dated 06/19/2014  FLUOROSCOPY TIME:  Fluoroscopy Time : 1 min 54 seconds  FINDINGS: Surgical drain in place in the upper abdomen. NG tube tip in the fundus of the stomach. Normal bowel gas pattern.  Contrast was injected in the NG tube with the patient in the right lateral decubitus position. There is flow of the contrast through the pylorus into the duodenal bulb and C-loop with no visible extravasation. There is mucosal edema in proximal duodenum and pylorus. Stomach appears normal.  IMPRESSION: No evidence of contrast extravasation during water-soluble upper GI. Edema of the proximal duodenum and pylorus.   Electronically Signed   By: Francene Boyers M.D.   On: 06/22/2014 10:20    Anti-infectives: Anti-infectives    Start     Dose/Rate Route Frequency Ordered Stop   06/23/14 1400  piperacillin-tazobactam (ZOSYN) IVPB 3.375 g     3.375 g 12.5 mL/hr over 240 Minutes Intravenous 3 times per day 06/23/14 0957     06/23/14 1030  amoxicillin (AMOXIL) capsule 1,000 mg  Status:  Discontinued     1,000 mg Oral Every 12 hours 06/23/14 0834 06/23/14 0957   06/23/14 1000  clarithromycin (BIAXIN) tablet 500 mg  Status:  Discontinued     500 mg Oral Every 12 hours 06/23/14 0834  06/23/14 0957   06/19/14 1500  piperacillin-tazobactam (ZOSYN) IVPB 3.375 g  Status:  Discontinued     3.375 g 12.5 mL/hr over 240 Minutes Intravenous 3 times per day 06/19/14 1119 06/23/14 0931   06/19/14 0645  piperacillin-tazobactam (ZOSYN) IVPB 3.375 g     3.375 g 100 mL/hr over 30 Minutes Intravenous  Once 06/19/14 0636 06/19/14 0803   06/19/14 0645  metroNIDAZOLE (FLAGYL) IVPB 500 mg     500 mg 100 mL/hr over 60 Minutes Intravenous  Once 06/19/14 0636 06/19/14 0906       Assessment/Plan Intraperitoneal free air & Perforated prepyloric ulcer  POD #5 s/p LAPAROSCOPIC PRIMARY REPAIR OF PERFORATED PYLORIC ULCER WITH GRAHAM PATCH (EW  06/19/14) -UGI without leak, edema to duodenum and pylorus, but contrast flows -Foley discontinued, urinating well on own -Ambulate and IS -SCD's and heparin -Day #5/5 Zosyn, will d/c today and start on "prev-pack" for h.pyolori -BID PO protonix -Oral pain meds -Dietitian saw for GERD/PUD education -Advance to fulls, soft tomorrow am if tolerating -Bowel regimen -F/u with Dr. Andrey Campanile at discharge, maybe later this week  GERD/PUD/H.pylori infection -Pt relates what sounds like prior h/o ulcers and significant GERD. Anesthesia did have difficulty in passing NG.  -H.pylori Ab test positive, start biaxin and amoxicillin today and continue protonix for 2-3 weeks BPH  -Had some minor difficulty with foley insertion in OR. Also reports significant BPH symptoms. Cont flomax.  -Voiding on own LLL pulmonary nodule  -Need outpt followup. Dr. Andrey Campanile discussed with patient. VTE prophylaxis  -Scds, subq heparin     LOS: 5 days    Nonie Hoyer 06/24/2014, 8:44 AM Pager: [phone]

## 2014-06-25 DIAGNOSIS — B9681 Helicobacter pylori [H. pylori] as the cause of diseases classified elsewhere: Secondary | ICD-10-CM | POA: Diagnosis present

## 2014-06-25 DIAGNOSIS — N400 Enlarged prostate without lower urinary tract symptoms: Secondary | ICD-10-CM | POA: Diagnosis present

## 2014-06-25 DIAGNOSIS — K279 Peptic ulcer, site unspecified, unspecified as acute or chronic, without hemorrhage or perforation: Secondary | ICD-10-CM

## 2014-06-25 MED ORDER — AMOXICILLIN 500 MG PO CAPS: 1000.0000 mg | ORAL_CAPSULE | Freq: Two times a day (BID) | ORAL | Status: DC

## 2014-06-25 MED ORDER — PANTOPRAZOLE SODIUM 40 MG PO TBEC: 40.0000 mg | DELAYED_RELEASE_TABLET | Freq: Two times a day (BID) | ORAL | Status: DC

## 2014-06-25 MED ORDER — POLYETHYLENE GLYCOL 3350 17 G PO PACK: 17.0000 g | PACK | Freq: Every day | ORAL | Status: DC | PRN

## 2014-06-25 MED ORDER — TAMSULOSIN HCL 0.4 MG PO CAPS: 0.4000 mg | ORAL_CAPSULE | Freq: Every day | ORAL | Status: DC

## 2014-06-25 MED ORDER — CLARITHROMYCIN 500 MG PO TABS: 500.0000 mg | ORAL_TABLET | Freq: Two times a day (BID) | ORAL | Status: DC

## 2014-06-25 MED ORDER — TAMSULOSIN HCL 0.4 MG PO CAPS
0.4000 mg | ORAL_CAPSULE | Freq: Once | ORAL | Status: AC
  Administered 2014-06-25: 0.4 mg via ORAL
  Filled 2014-06-25: qty 1

## 2014-06-25 MED ORDER — HYDROCODONE-ACETAMINOPHEN 5-325 MG PO TABS: 1.0000 | ORAL_TABLET | Freq: Four times a day (QID) | ORAL | Status: DC | PRN

## 2014-06-25 MED ORDER — DOCUSATE SODIUM 100 MG PO CAPS: 100.0000 mg | ORAL_CAPSULE | Freq: Two times a day (BID) | ORAL | Status: DC | PRN

## 2014-06-25 MED ORDER — BISACODYL 10 MG RE SUPP: 10.0000 mg | Freq: Every day | RECTAL | Status: DC | PRN

## 2014-06-25 NOTE — Discharge Summary (Signed)
[facility] Discharge Summary   Patient ID: Shaun Santana MRN: [id number] DOB/AGE: September 09, 1955 59 y.o.  Admit date: 06/19/2014 Discharge date: 06/25/2014  Admitting Diagnosis: Perforated peptic ulcer Solitary pulmonary nodule (LLL lung) Free intraperitoneal air  Discharge Diagnosis Patient Active Problem List   Diagnosis Date Noted  . H pylori ulcer 06/25/2014  . BPH (benign prostatic hyperplasia) 06/25/2014  . Perforated peptic ulcer 06/20/2014  . Solitary pulmonary nodule 06/20/2014  . Free intraperitoneal air 06/19/2014    Consultants None  Imaging: No results found.  Procedures Dr. Andrey Campanile (06/19/14) - Laparoscopic primary repair of perforated pyloric ulcer with graham patch  Hospital Course:  59 year old obese male presenting with abdominal pain. Duration of symptoms is 2-3 weeks. Coarse is worsening since about 3 AM. Location is RUQ. Characterized as cramping pain. severe in severity. Time pattern is constant. Associated with nausea, constipation. Denies fever, chills or sweats. He admits to taking 2 aleve daily for the past 2 weeks. Denies melena, hematochezia, vomiting. Last oral intake was yesterday. He reports GERD like symptoms, but no chest pains, PND, orthopnea or DOE. Denies significant medical problems. Reports having a hernia repair in 13-14. Denies use of anticoagulation.  His work up shows a normal white count, renal function and electrolytes. CT of A/P shows free air, inflammatory changes around the pylorus and proximal duodenum. We have therefore been asked to evaluate.   Patient was admitted and underwent procedure listed above.  Tolerated procedure well and was transferred to the floor.  Patients bowel function slowly returned as the ileus resolved.  Foley was maintained until 06/22/14 secondary to h/o BPH, he was able to successfully void with the help of flomax.  Flomax was eventually discontinued.  An UGI was done on  06/22/14 which showed patency into duodenum without leak.  There was some edema noted.  NG tube was discontinued on 06/22/14 and he was started on clears on 06/23/14.  Diet was advanced as tolerated.  He was found to have a positive H.pyolori antibody, thus he was transitioned after 5 days of Zosyn to "prev-pack" consisting of amoxicillin, clarithromycin, and protonix to irradiate the infection which was the likely cause of his ulcer disease.  We had a dietitian see the patient to discuss GERD/PUD diet.  He is encouraged to continue with a soft diet for the next week and follow the PUD diet indefinately.  He is cautioned on use of NSAIDS and this was added to his list of allergies as a contraindication.  We encouraged a bowel regimen upon discharge.  On day of discharge his JP drain was discontinued secondary to being clear and minimal output.  Patient is encouraged follow up with his PCP where he should discuss his solitary LLL lung nodule which was incidentally found on CT scan on admission.  On POD #6, the patient was voiding well, tolerating diet, ambulating well, pain well controlled, vital signs stable, incisions c/d/i and felt stable for discharge home.  Patient will follow up in our office in 2-3 weeks and knows to call with questions or concerns.   Physical Exam: General:  Alert, NAD, pleasant, comfortable Abd:  Soft, ND, mild tenderness, incisions C/D/I, drain with minimal serous drainage (will d/c today prior to d/c).     Medication List    TAKE these medications        amoxicillin 500 MG capsule  Commonly known as:  AMOXIL  Take 2 capsules (1,000 mg total) by mouth every 12 (twelve) hours.  bisacodyl 10 MG suppository  Commonly known as:  DULCOLAX  Place 1 suppository (10 mg total) rectally daily as needed for moderate constipation.     clarithromycin 500 MG tablet  Commonly known as:  BIAXIN  Take 1 tablet (500 mg total) by mouth every 12 (twelve) hours.     docusate sodium 100  MG capsule  Commonly known as:  COLACE  Take 1 capsule (100 mg total) by mouth 2 (two) times daily as needed for mild constipation.     HYDROcodone-acetaminophen 5-325 MG per tablet  Commonly known as:  NORCO/VICODIN  Take 1-2 tablets by mouth every 6 (six) hours as needed for moderate pain or severe pain.     pantoprazole 40 MG tablet  Commonly known as:  PROTONIX  Take 1 tablet (40 mg total) by mouth 2 (two) times daily before a meal.     polyethylene glycol packet  Commonly known as:  MIRALAX / GLYCOLAX  Take 17 g by mouth daily as needed for mild constipation or moderate constipation.     tamsulosin 0.4 MG Caps capsule  Commonly known as:  FLOMAX  Take 1 capsule (0.4 mg total) by mouth daily.         Follow-up Information    Follow up with Atilano Ina, MD. Schedule an appointment as soon as possible for a visit in 2 weeks.   Specialty:  General Surgery   Why:  For post-operation check, call to confirm date/time of appointment by Monday 06/29/14   Contact information:   [address] [place] [address] [phone]       Follow up with [facility]    . Schedule an appointment as soon as possible for a visit in 2 weeks.   Why:  For post-hospital follow up with a primary care provider ASAP, you will need to discuss your lung nodule and possibility of repeat xrays.   Contact information:   [address] [place] [address] [phone]      Signed: Nonie Hoyer, [facility] [phone]  06/25/2014, 8:28 AM

## 2014-06-25 NOTE — Progress Notes (Signed)
Over[place] Mam Heigl to be D/C'd Home per MD order.  Discussed with the patient and all questions fully answered.  VSS, Skin clean, dry and intact without evidence of skin break down, no evidence of skin tears noted. IV catheter discontinued intact. Site without signs and symptoms of complications. Dressing and pressure applied.  An After Visit Summary was printed and given to the patient. Patient received prescription.  D/c education completed with patient/family including follow up instructions, medication list, d/c activities limitations if indicated, with other d/c instructions as indicated by MD - patient able to verbalize understanding, all questions fully answered.   Patient instructed to return to ED, call 911, or call MD for any changes in condition.   Patient escorted via WC, and D/C home via private auto.  Vivianne Master Sneijder Bernards 06/25/2014 1:33 PM

## 2014-06-25 NOTE — Discharge Instructions (Addendum)
LAPAROSCOPIC SURGERY: POST OP INSTRUCTIONS  1. DIET: Follow a light bland diet the first 24 hours after arrival home, such as soup, liquids, crackers, etc. Be sure to include lots of fluids daily. Avoid fast food or heavy meals as your are more likely to get nauseated. Eat a low fat the next few days after surgery.  2. Take your usually prescribed home medications unless otherwise directed. 3. PAIN CONTROL:  1. Pain is best controlled by a usual combination of three different methods TOGETHER:  1. Ice/Heat 2. Over the counter pain medication 3. Prescription pain medication 2. Most patients will experience some swelling and bruising around the incisions. Ice packs or heating pads (30-60 minutes up to 6 times a day) will help. Use ice for the first few days to help decrease swelling and bruising, then switch to heat to help relax tight/sore spots and speed recovery. Some people prefer to use ice alone, heat alone, alternating between ice & heat. Experiment to what works for you. Swelling and bruising can take several weeks to resolve.  3. It is helpful to take an over-the-counter pain medication regularly for the first few weeks. Choose one of the following that works best for you:  1. Naproxen (Aleve, etc) Two 220mg  tabs twice a day 2. Ibuprofen (Advil, etc) Three 200mg  tabs four times a day (every meal & bedtime) 3. Acetaminophen (Tylenol, etc) 500-650mg  four times a day (every meal & bedtime) 4. A prescription for pain medication (such as oxycodone, hydrocodone, etc) should be given to you upon discharge. Take your pain medication as prescribed.  1. If you are having problems/concerns with the prescription medicine (does not control pain, nausea, vomiting, rash, itching, etc), please call us [phone] to see if we need to switch you to a different pain medicine that will work better for you and/or control your side effect better. 2. If you need a refill on your pain medication, please contact  your pharmacy. They will contact our office to request authorization. Prescriptions will not be filled after 5 pm or on week-ends. 4. Avoid getting constipated. Between the surgery and the pain medications, it is common to experience some constipation. Increasing fluid intake and taking a fiber supplement (such as Metamucil, Citrucel, FiberCon, MiraLax, etc) 1-2 times a day regularly will usually help prevent this problem from occurring. A mild laxative (prune juice, Milk of Magnesia, MiraLax, etc) should be taken according to package directions if there are no bowel movements after 48 hours.  5. Watch out for diarrhea. If you have many loose bowel movements, simplify your diet to bland foods & liquids for a few days. Stop any stool softeners and decrease your fiber supplement. Switching to mild anti-diarrheal medications (Kayopectate, Pepto Bismol) can help. If this worsens or does not improve, please call us. 6. Wash / shower every day. You may shower over the dressings as they are waterproof. Continue to shower over incision(s) after the dressing is off. 7. Remove your waterproof bandages 5 days after surgery. You may leave the incision open to air. You may replace a dressing/Band-Aid to cover the incision for comfort if you wish.  8. ACTIVITIES as tolerated:  1. You may resume regular (light) daily activities beginning the next day--such as daily self-care, walking, climbing stairs--gradually increasing activities as tolerated. If you can walk 30 minutes without difficulty, it is safe to try more intense activity such as jogging, treadmill, bicycling, low-impact aerobics, swimming, etc. 2. Save the most intensive and strenuous activity for  last such as sit-ups, heavy lifting, contact sports, etc Refrain from any heavy lifting or straining until you are off narcotics for pain control.  3. DO NOT PUSH THROUGH PAIN. Let pain be your guide: If it hurts to do something, don't do it. Pain is your body warning  you to avoid that activity for another week until the pain goes down. 4. You may drive when you are no longer taking prescription pain medication, you can comfortably wear a seatbelt, and you can safely maneuver your car and apply brakes. 5. You may have sexual intercourse when it is comfortable.  9. FOLLOW UP in our office  1. Please call [facility] at [phone] to set up an appointment to see your surgeon in the office for a follow-up appointment approximately 2-3 weeks after your surgery. 2. Make sure that you call for this appointment the day you arrive home to insure a convenient appointment time.      10. IF YOU HAVE DISABILITY OR FAMILY LEAVE FORMS, BRING THEM TO THE               OFFICE FOR PROCESSING.   WHEN TO CALL us [phone]:  1. Poor pain control 2. Reactions / problems with new medications (rash/itching, nausea, etc)  3. Fever over 101.5 F (38.5 C) 4. Inability to urinate 5. Nausea and/or vomiting 6. Worsening swelling or bruising 7. Continued bleeding from incision. 8. Increased pain, redness, or drainage from the incision  The clinic staff is available to answer your questions during regular business hours (8:30am-5pm). Please dont hesitate to call and ask to speak to one of our nurses for clinical concerns.  If you have a medical emergency, go to the nearest emergency room or call 911.  A surgeon from [facility] is always on call at the [facility], [place]  [address], [address], [place], [place] [address] ?  MAIN: [phone] ? TOLL FREE: [phone] ?  FAX [phone]  [email or web address]   Food Choices for Gastroesophageal Reflux Disease When you have gastroesophageal reflux disease (GERD), the foods you eat and your eating habits are very important. Choosing the right foods can help ease the discomfort of GERD. WHAT GENERAL GUIDELINES DO I NEED TO FOLLOW?  Choose fruits, vegetables, whole grains,  low-fat dairy products, and low-fat meat, fish, and poultry.  Limit fats such as oils, salad dressings, butter, nuts, and avocado.  Keep a food diary to identify foods that cause symptoms.  Avoid foods that cause reflux. These may be different for different people.  Eat frequent small meals instead of three large meals each day.  Eat your meals slowly, in a relaxed setting.  Limit fried foods.  Cook foods using methods other than frying.  Avoid drinking alcohol.  Avoid drinking large amounts of liquids with your meals.  Avoid bending over or lying down until 2-3 hours after eating. WHAT FOODS ARE NOT RECOMMENDED? The following are some foods and drinks that may worsen your symptoms: Vegetables Tomatoes. Tomato juice. Tomato and spaghetti sauce. Chili peppers. Onion and garlic. Horseradish. Fruits Oranges, grapefruit, and lemon (fruit and juice). Meats High-fat meats, fish, and poultry. This includes hot dogs, ribs, ham, sausage, salami, and bacon. Dairy Whole milk and chocolate milk. Sour cream. Cream. Butter. Ice cream. Cream cheese.  Beverages Coffee and tea, with or without caffeine. Carbonated beverages or energy drinks. Condiments Hot sauce. Barbecue sauce.  Sweets/Desserts Chocolate and cocoa. Donuts. Peppermint and spearmint.  Fats and Oils High-fat foods, including [place] fries and potato chips. Other Vinegar. Strong spices, such as black pepper, white pepper, red pepper, cayenne, curry powder, cloves, ginger, and chili powder. The items listed above may not be a complete list of foods and beverages to avoid. Contact your dietitian for more information. Document Released: 12/26/2004 Document Revised: 12/31/2012 Document Reviewed: 10/30/2012 [facility] Patient Information 2015 [place], [place]. This information is not intended to replace advice given to you by your health care provider. Make sure you discuss any questions you have with your health care  provider.  Peptic Ulcer A peptic ulcer is a sore in the lining of your esophagus (esophageal ulcer), stomach (gastric ulcer), or in the first part of your small intestine (duodenal ulcer). The ulcer causes erosion into the deeper tissue. CAUSES  Normally, the lining of the stomach and the small intestine protects itself from the acid that digests food. The protective lining can be damaged by:  An infection caused by a bacterium called Helicobacter pylori (H. pylori).  Regular use of nonsteroidal anti-inflammatory drugs (NSAIDs), such as ibuprofen or aspirin.  Smoking tobacco. Other risk factors include being older than 50, drinking alcohol excessively, and having a family history of ulcer disease.  SYMPTOMS   Burning pain or gnawing in the area between the chest and the belly button.  Heartburn.  Nausea and vomiting.  Bloating. The pain can be worse on an empty stomach and at night. If the ulcer results in bleeding, it can cause:  Black, tarry stools.  Vomiting of bright red blood.  Vomiting of coffee-ground-looking materials. DIAGNOSIS  A diagnosis is usually made based upon your history and an exam. Other tests and procedures may be performed to find the cause of the ulcer. Finding a cause will help determine the best treatment. Tests and procedures may include:  Blood tests, stool tests, or breath tests to check for the bacterium H. pylori.  An upper gastrointestinal (GI) series of the esophagus, stomach, and small intestine.  An endoscopy to examine the esophagus, stomach, and small intestine.  A biopsy. TREATMENT  Treatment may include:  Eliminating the cause of the ulcer, such as smoking, NSAIDs, or alcohol.  Medicines to reduce the amount of acid in your digestive tract.  Antibiotic medicines if the ulcer is caused by the H. pylori bacterium.  An upper endoscopy to treat a bleeding ulcer.  Surgery if the bleeding is severe or if the ulcer created a hole  somewhere in the digestive system. HOME CARE INSTRUCTIONS   Avoid tobacco, alcohol, and caffeine. Smoking can increase the acid in the stomach, and continued smoking will impair the healing of ulcers.  Avoid foods and drinks that seem to cause discomfort or aggravate your ulcer.  Only take medicines as directed by your caregiver. Do not substitute over-the-counter medicines for prescription medicines without talking to your caregiver.  Keep any follow-up appointments and tests as directed. SEEK MEDICAL CARE IF:   Your do not improve within 7 days of starting treatment.  You have ongoing indigestion or heartburn. SEEK IMMEDIATE MEDICAL CARE IF:   You have sudden, sharp, or persistent abdominal pain.  You have bloody or dark black, tarry stools.  You vomit blood or vomit that looks like coffee grounds.  You become light-headed, weak, or feel faint.  You become sweaty or clammy. MAKE SURE YOU:   Understand these instructions.  Will watch your condition.  Will get help right away if you are not doing well or get  worse. Document Released: 12/24/1999 Document Revised: 05/12/2013 Document Reviewed: 07/26/2011 [facility] Patient Information 2015 [place], [place]. This information is not intended to replace advice given to you by your health care provider. Make sure you discuss any questions you have with your health care provider.

## 2014-07-06 ENCOUNTER — Inpatient Hospital Stay: Payer: BC Managed Care – PPO | Admitting: Family Medicine

## 2014-07-09 ENCOUNTER — Encounter: Payer: Self-pay | Admitting: Family Medicine

## 2014-07-09 ENCOUNTER — Ambulatory Visit: Payer: BC Managed Care – PPO | Attending: Family Medicine | Admitting: Family Medicine

## 2014-07-09 VITALS — BP 124/86 | HR 76 | Temp 97.9°F | Resp 18 | Ht 70.0 in | Wt 257.6 lb

## 2014-07-09 DIAGNOSIS — N400 Enlarged prostate without lower urinary tract symptoms: Secondary | ICD-10-CM | POA: Insufficient documentation

## 2014-07-09 DIAGNOSIS — R911 Solitary pulmonary nodule: Secondary | ICD-10-CM | POA: Diagnosis not present

## 2014-07-09 DIAGNOSIS — K275 Chronic or unspecified peptic ulcer, site unspecified, with perforation: Secondary | ICD-10-CM | POA: Insufficient documentation

## 2014-07-09 NOTE — Patient Instructions (Signed)
Pulmonary Nodule A pulmonary nodule is a small, round growth of tissue in the lung. Pulmonary nodules can range in size from less than 1/5 inch (4 mm) to a little bigger than an inch (25 mm). Most pulmonary nodules are detected when imaging tests of the lung are being performed for a different problem. Pulmonary nodules are usually not cancerous (benign). However, some pulmonary nodules are cancerous (malignant). Follow-up treatment or testing is based on the size of the pulmonary nodule and your risk of getting lung cancer.  CAUSES Benign pulmonary nodules can be caused by various things. Some of the causes include:   Bacterial, fungal, or viral infections. This is usually an old infection that is no longer active, but it can sometimes be a current, active infection.  A benign mass of tissue.  Inflammation from conditions such as rheumatoid arthritis.   Abnormal blood vessels in the lungs. Malignant pulmonary nodules can result from lung cancer or from cancers that spread to the lung from other places in the body. SIGNS AND SYMPTOMS Pulmonary nodules usually do not cause symptoms. DIAGNOSIS Most often, pulmonary nodules are found incidentally when an X-ray or CT scan is performed to look for some other problem in the lung area. To help determine whether a pulmonary nodule is benign or malignant, your health care provider will take a medical history and order a variety of tests. Tests done may include:   Blood tests.  A skin test called a tuberculin test. This test is used to determine if you have been exposed to the germ that causes tuberculosis.   Chest X-rays. If possible, a new X-ray may be compared with X-rays you have had in the past.   CT scan. This test shows smaller pulmonary nodules more clearly than an X-ray.   Positron emission tomography (PET) scan. In this test, a safe amount of a radioactive substance is injected into the bloodstream. Then, the scan takes a picture of  the pulmonary nodule. The radioactive substance is eliminated from your body in your urine.   Biopsy. A tiny piece of the pulmonary nodule is removed so it can be checked under a microscope. TREATMENT  Pulmonary nodules that are benign normally do not require any treatment because they usually do not cause symptoms or breathing problems. Your health care provider may want to monitor the pulmonary nodule through follow-up CT scans. The frequency of these CT scans will vary based on the size of the nodule and the risk factors for lung cancer. For example, CT scans will need to be done more frequently if the pulmonary nodule is larger and if you have a history of smoking and a family history of cancer. Further testing or biopsies may be done if any follow-up CT scan shows that the size of the pulmonary nodule has increased. HOME CARE INSTRUCTIONS  Only take over-the-counter or prescription medicines as directed by your health care provider.  Keep all follow-up appointments with your health care provider. SEEK MEDICAL CARE IF:  You have trouble breathing when you are active.   You feel sick or unusually tired.   You do not feel like eating.   You lose weight without trying to.   You develop chills or night sweats.  SEEK IMMEDIATE MEDICAL CARE IF:  You cannot catch your breath, or you begin wheezing.   You cannot stop coughing.   You cough up blood.   You become dizzy or feel like you are going to pass out.   You   have sudden chest pain.   You have a fever or persistent symptoms for more than 2-3 days.   You have a fever and your symptoms suddenly get worse. MAKE SURE YOU:  Understand these instructions.  Will watch your condition.  Will get help right away if you are not doing well or get worse. Document Released: 10/23/2008 Document Revised: 08/28/2012 Document Reviewed: 06/17/2012 Exit[employer] Patient Information 2015 [employer]. This information is not intended  to replace advice given to you by your health care provider. Make sure you discuss any questions you have with your health care provider.  

## 2014-07-09 NOTE — Progress Notes (Signed)
Patient here for HFU.Patient reports he is not in any pain today but just a little sore from his surgery on 06/18/13. Patient reports he has been feeling fine since he has been out of the hospital. Patient reports he was told he needs to get an x-ray due to a spot that was seen on his lungs when a CAT scan was performed. Patient denies any nausea, vomitting, and diarrhea.

## 2014-07-09 NOTE — Progress Notes (Addendum)
Subjective:    Patient ID: Shaun Santana, male    DOB: 12-29-1955, 58 y.o.   MRN: [id number]  HPI  Admit date: 06/19/14 Discharge date: 06/25/14  Shaun Santana is a 59 year old male who presented to the ED with abdominal pain in the right upper quadrant going on 2-3 weeks. On presentation his blood work was normal, his CT of the abdomen and pelvis revealed free intraperitoneal air, inflammatory changes around the pylorus and proximal duodenum and this appears to be the most likely source of the free air, Noncalcified 4 mm left lower lobe pulmonary nodule He underwent laparoscopic primary repair of perforated pyloric ulcer with Cheree Ditto patch on 06/19/14 and bowel function returned gradually. He did require a Foley catheter for a bit due to his history of BPH but then was able to void after initiation of Flomax. He tested positive for H. pylori antibody and so was transitioned from Zosyn to Prevpac (amoxicillin, clarithromycin, Protonix). He was discharged after his condition improved and was advised to follow-up outpatient for further workup of his solitary pulmonary nodule.   Interval history: Patient states he feels much better now and pain is very minimal. He moves his bowels regulary and is not constipated. He was a former smoker who quit a year ago; he also used to drink but quit that a year ago as well.Family history is positive for lung cancer in his sister.  History reviewed. No pertinent past medical history.  Past Surgical History  Procedure Laterality Date  . Hernia repair    . Laparoscopy N/A 06/19/2014    Procedure: LAPAROSCOPIC  REPAIR OF PERFORATED PYLORIC ULCER WITH Peggye Ley;  Surgeon: Gaynelle Adu, MD;  Location: [facility] OR;  Service: General;  Laterality: N/A;    History reviewed. No pertinent family history.  History   Social History  . Marital Status: Legally Separated    Spouse Name: N/A  . Number of Children: N/A  . Years of Education: N/A   Occupational  History  . Not on file.   Social History Main Topics  . Smoking status: Never Smoker   . Smokeless tobacco: Not on file  . Alcohol Use: Yes     Comment: 1 pint every weekend  . Drug Use: Yes    Special: Marijuana  . Sexual Activity: Not on file   Other Topics Concern  . Not on file   Social History Narrative    Allergies  Allergen Reactions  . Nsaids Other (See Comments)    SECONDARY TO H/O PERFORATED ULCER    Current Outpatient Prescriptions on File Prior to Visit  Medication Sig Dispense Refill  . amoxicillin (AMOXIL) 500 MG capsule Take 2 capsules (1,000 mg total) by mouth every 12 (twelve) hours. 84 capsule 0  . bisacodyl (DULCOLAX) 10 MG suppository Place 1 suppository (10 mg total) rectally daily as needed for moderate constipation.  0  . clarithromycin (BIAXIN) 500 MG tablet Take 1 tablet (500 mg total) by mouth every 12 (twelve) hours. 42 tablet 0  . docusate sodium (COLACE) 100 MG capsule Take 1 capsule (100 mg total) by mouth 2 (two) times daily as needed for mild constipation.  0  . HYDROcodone-acetaminophen (NORCO/VICODIN) 5-325 MG per tablet Take 1-2 tablets by mouth every 6 (six) hours as needed for moderate pain or severe pain. 40 tablet 0  . pantoprazole (PROTONIX) 40 MG tablet Take 1 tablet (40 mg total) by mouth 2 (two) times daily before a meal. 42 tablet 1  . tamsulosin (FLOMAX)  0.4 MG CAPS capsule Take 1 capsule (0.4 mg total) by mouth daily. 30 capsule 0  . polyethylene glycol (MIRALAX / GLYCOLAX) packet Take 17 g by mouth daily as needed for mild constipation or moderate constipation. (Patient not taking: Reported on 07/09/2014)  0   No current facility-administered medications on file prior to visit.       Review of Systems  Constitutional: Negative for activity change and appetite change.  HENT: Negative for sinus pressure and sore throat.   Eyes: Negative for visual disturbance.  Respiratory: Negative for chest tightness and shortness of breath.     Cardiovascular: Negative for chest pain and palpitations.  Gastrointestinal: Negative for abdominal pain and abdominal distention.  Endocrine: Negative for cold intolerance, heat intolerance and polyphagia.  Genitourinary: Negative for dysuria, frequency and difficulty urinating.  Musculoskeletal: Negative for back pain, joint swelling and arthralgias.  Skin: Negative for color change.  Neurological: Negative for dizziness, tremors and weakness.  Psychiatric/Behavioral: Negative for suicidal ideas and behavioral problems.         Objective: Filed Vitals:   07/09/14 1110  BP: 124/86  Pulse: 76  Temp: 97.9 F (36.6 C)  TempSrc: Oral  Resp: 18  Height: 5\' 10"  (1.778 m)  Weight: 257 lb 9.6 oz (116.847 kg)  SpO2: 96%      Physical Exam  Constitutional: He is oriented to person, place, and time.  obese  HENT:  Head: Normocephalic and atraumatic.  Right Ear: External ear normal.  Left Ear: External ear normal.  Eyes: Conjunctivae and EOM are normal. Pupils are equal, round, and reactive to light.  Neck: Normal range of motion. Neck supple. No tracheal deviation present.  Cardiovascular: Normal rate, regular rhythm and normal heart sounds.   No murmur heard. Pulmonary/Chest: Effort normal and breath sounds normal. No respiratory distress. He has no wheezes. He exhibits no tenderness.  Abdominal: Soft. Bowel sounds are normal. He exhibits no mass. There is no tenderness.  Healed laparoscopy scars  Musculoskeletal: Normal range of motion. He exhibits no edema or tenderness.  Neurological: He is alert and oriented to person, place, and time.  Skin: Skin is warm and dry.  Psychiatric: He has a normal mood and affect.     CLINICAL DATA: Right flank pain often on for the past week  EXAM: CT ABDOMEN AND PELVIS WITHOUT CONTRAST  TECHNIQUE: Multidetector CT imaging of the abdomen and pelvis was performed following the standard protocol without IV contrast.  COMPARISON:  05/18/2010  FINDINGS: There is free intraperitoneal air. There is mural thickening and edema around the pylorus and proximal duodenum, and I suspect peptic ulcer disease at this location to be the cause of the free air.  There is no bowel obstruction. There is no abscess. There is no ascites.  The colon appears unremarkable.  There are unremarkable appearances of the liver, spleen, pancreas, adrenals, kidneys, gallbladder and bile ducts.  There is a noncalcified 4 mm nodule in the posterior left lower lobe, axial image 11 series 205. This may be unchanged from 05/18/2010 but there was mild basilar atelectasis partially obscuring this area on that prior study.  IMPRESSION: Free intraperitoneal air. There are inflammatory changes around the pylorus and proximal duodenum, and this appears to be the most likely source of the free air. These results were called by telephone at the time of interpretation on 06/19/2014 at 6:35 am to Dr. Loren RacerAVID YELVERTON , who verbally acknowledged these results.  Noncalcified 4 mm left lower lobe pulmonary nodule If  the patient is at high risk for bronchogenic carcinoma, follow-up chest CT at 1 year is recommended. If the patient is at low risk, no follow-up is needed. This recommendation follows the consensus statement: Guidelines for Management of Small Pulmonary Nodules Detected on CT Scans: A Statement from the Fleischner Society as published in Radiology 2005; 237:395-400.   Electronically Signed  By: Ellery Plunk M.D.  On: 06/19/2014 06:40     Assessment & Plan:  59 year old patient with history of BPH, finding of solitary pulmonary nodule on imaging, perforated peptic ulcer status post repair with Cheree Ditto patch now doing well.   Perforated peptic ulcer status post repair: Incisions have healed completely and patient is doing well and moving his bowels.  BPH: Controlled on tamsulosin.  Solitary pulmonary  nodule: Referred for CT chest without contrast for further evaluation given his previous history of smoking and his family history of lung cancer.   This note has been created with Education officer, environmental. Any transcriptional errors are unintentional.

## 2014-07-16 ENCOUNTER — Ambulatory Visit (HOSPITAL_COMMUNITY): Payer: BC Managed Care – PPO

## 2014-07-21 ENCOUNTER — Telehealth: Payer: Self-pay | Admitting: General Practice

## 2014-07-21 NOTE — Telephone Encounter (Signed)
Pt calling to follow up on appointment for updated x-ray procedure; pt requested change in order for [employer] to cover imaging.  Please f/u with pt for specifications.

## 2014-08-06 ENCOUNTER — Ambulatory Visit: Payer: BC Managed Care – PPO | Admitting: Family Medicine

## 2014-08-24 ENCOUNTER — Ambulatory Visit: Payer: BC Managed Care – PPO | Admitting: Family Medicine

## 2015-11-15 ENCOUNTER — Ambulatory Visit: Payer: BC Managed Care – PPO | Attending: Family Medicine | Admitting: Family Medicine

## 2015-11-15 ENCOUNTER — Other Ambulatory Visit: Payer: Self-pay | Admitting: Family Medicine

## 2015-11-15 ENCOUNTER — Encounter: Payer: Self-pay | Admitting: Family Medicine

## 2015-11-15 VITALS — BP 143/91 | HR 79 | Temp 98.1°F | Ht 70.0 in | Wt 264.4 lb

## 2015-11-15 DIAGNOSIS — R911 Solitary pulmonary nodule: Secondary | ICD-10-CM | POA: Diagnosis present

## 2015-11-15 DIAGNOSIS — L858 Other specified epidermal thickening: Secondary | ICD-10-CM | POA: Diagnosis not present

## 2015-11-15 DIAGNOSIS — H9190 Unspecified hearing loss, unspecified ear: Secondary | ICD-10-CM | POA: Diagnosis not present

## 2015-11-15 DIAGNOSIS — J309 Allergic rhinitis, unspecified: Secondary | ICD-10-CM | POA: Insufficient documentation

## 2015-11-15 DIAGNOSIS — L918 Other hypertrophic disorders of the skin: Secondary | ICD-10-CM | POA: Diagnosis not present

## 2015-11-15 MED ORDER — FLUTICASONE PROPIONATE 50 MCG/ACT NA SUSP: 2.0000 | Freq: Every day | NASAL | 6 refills | Status: AC

## 2015-11-15 NOTE — Assessment & Plan Note (Signed)
F/u CT chest ordered

## 2015-11-15 NOTE — Progress Notes (Signed)
Pt is here to follow up on pulmonary nodule, pt also had a skin tag on right side of neck.  Pt declined flu shot.

## 2015-11-15 NOTE — Assessment & Plan Note (Signed)
Ear pressure with evidence of allergic rhinitis flonase ordered

## 2015-11-15 NOTE — Patient Instructions (Addendum)
Shaun Santana was seen today for follow-up.  Diagnoses and all orders for this visit:  Solitary pulmonary nodule -     CT CHEST NODULE FOLLOW UP LOW DOSE W/O; Future  Allergic rhinitis, unspecified chronicity, unspecified seasonality, unspecified trigger -     fluticasone (FLONASE) 50 MCG/ACT nasal spray; Place 2 sprays into both nostrils daily.  BP elevated today, Reduce salt in diet Increase exercise   F/u in  6 weeks for wellness physical  Dr. Armen PickupFunches

## 2015-11-15 NOTE — Assessment & Plan Note (Signed)
Removed Today

## 2015-11-15 NOTE — Progress Notes (Signed)
Subjective:  Patient ID: Shaun Santana Dottavio, male    DOB: 1955-03-14  Age: 60 y.o. MRN: [id number]  CC: Follow-up   HPI Shaun Santana Pangborn presents for   1. Santana lung nodule: noted on CT abdomen last year, 06/19/2014. 4 mm left lower lung nodule, f/u imaging in 1 year recommended.  He is a former smoker of marijuana for about 30 years. Never smoked cigarettes.  He has intermittent Santana anterior chest pains that are sharp. No SOB or cough. No fever, chills or weight loss.   2. 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 Itching and irritated. Enlarging. He desires removal.  3. Poor hearing; since age 60. Turns of television loudly. Brother says he hard of hearing. No ear pain or ringing in ears.   Social History  Substance Use Topics  . Smoking status: Never Smoker  . Smokeless tobacco: Not on file  . Alcohol use Yes     Comment: 1 pint every weekend     Outpatient Medications Prior to Visit  Medication Sig Dispense Refill  . amoxicillin (AMOXIL) 500 MG capsule Take 2 capsules (1,000 mg total) by mouth every 12 (twelve) hours. (Patient not taking: Reported on 11/15/2015) 84 capsule 0  . bisacodyl (DULCOLAX) 10 MG suppository Place 1 suppository (10 mg total) rectally daily as needed for moderate constipation. (Patient not taking: Reported on 11/15/2015)  0  . clarithromycin (BIAXIN) 500 MG tablet Take 1 tablet (500 mg total) by mouth every 12 (twelve) hours. (Patient not taking: Reported on 11/15/2015) 42 tablet 0  . docusate sodium (COLACE) 100 MG capsule Take 1 capsule (100 mg total) by mouth 2 (two) times daily as needed for mild constipation. (Patient not taking: Reported on 11/15/2015)  0  . HYDROcodone-acetaminophen (NORCO/VICODIN) 5-325 MG per tablet Take 1-2 tablets by mouth every 6 (six) hours as needed for moderate pain or severe pain. (Patient not taking: Reported on 11/15/2015) 40 tablet 0  . pantoprazole (PROTONIX) 40 MG tablet Take 1 tablet (40 mg total) by mouth 2 (two) times daily before  a meal. (Patient not taking: Reported on 11/15/2015) 42 tablet 1  . polyethylene glycol (MIRALAX / GLYCOLAX) packet Take 17 g by mouth daily as needed for mild constipation or moderate constipation. (Patient not taking: Reported on 11/15/2015)  0  . tamsulosin (FLOMAX) 0.4 MG CAPS capsule Take 1 capsule (0.4 mg total) by mouth daily. (Patient not taking: Reported on 11/15/2015) 30 capsule 0   No facility-administered medications prior to visit.     ROS Review of Systems  Constitutional: Negative for chills, fatigue, fever and unexpected weight change.  HENT: Positive for hearing loss.   Eyes: Negative for visual disturbance.  Respiratory: Negative for cough and shortness of breath.   Cardiovascular: Positive for chest pain (intermittent sharp pains in chest ). Negative for palpitations and leg swelling.  Gastrointestinal: Negative for abdominal pain, blood in stool, constipation, diarrhea, nausea and vomiting.  Endocrine: Negative for polydipsia, polyphagia and polyuria.  Musculoskeletal: Negative for arthralgias, back pain, gait problem, myalgias and neck pain.  Skin: Negative for rash.  Allergic/Immunologic: Negative for immunocompromised state.  Hematological: Negative for adenopathy. Does not bruise/bleed easily.  Psychiatric/Behavioral: Negative for dysphoric mood, sleep disturbance and suicidal ideas. The patient is not nervous/anxious.     Objective:  BP (!) 143/91 (BP Location: Left Arm, Patient Position: Sitting, Cuff Size: Large)   Pulse 79   Temp 98.1 F (36.7 C) (Oral)   Ht 5\' 10"  (1.778 m)  Wt 264 lb 6.4 oz (119.9 kg)   SpO2 90%   BMI 37.94 kg/m   BP/Weight 11/15/2015 07/09/2014 06/25/2014  Systolic BP 143 124 131  Diastolic BP 91 86 81  Wt. (Lbs) 264.4 257.6 -  BMI 37.94 36.96 -    Physical Exam  Constitutional: He appears well-developed and well-nourished. No distress.  HENT:  Head: Normocephalic and atraumatic.  Right Ear: Tympanic membrane, external ear and  ear canal normal.  Left Ear: Tympanic membrane, external ear and ear canal normal.  Nose: Mucosal edema present.  Neck: Normal range of motion. Neck supple.    Cardiovascular: Normal rate, regular rhythm, normal heart sounds and intact distal pulses.   Pulmonary/Chest: Effort normal and breath sounds normal.  Musculoskeletal: He exhibits no edema.  Neurological: He is alert.  Skin: Skin is warm and dry. No rash noted. No erythema.  Psychiatric: He has a normal mood and affect.   Cutaneous Horn  Removal Procedure Note  Pre-operative Diagnosis: Cutaneous Horn   Post-operative Diagnosis: Cutaneous Horn   Locations:right neck   Indications: irritation and pain relief   Anesthesia: Lidocaine 1% with epinephrine without added sodium bicarbonate  Procedure Details  The risks (including bleeding and infection) and benefits of the procedure and Written informed consent obtained. Using sterile iris scissors, multiple skin tags were snipped off at their bases after cleansing with Betadine.  Bleeding was controlled by pressure.   Findings: Pathognomonic benign lesions  not sent for pathological exam.  Condition: Stable  Complications: none.  Plan: 1. Instructed to keep the wounds dry and covered for 24-48h and clean thereafter. 2. Warning signs of infection were reviewed.   3. Recommended that the patient use OTC analgesics as needed for pain.  4. Return as needed.  Assessment & Plan:  Thomasenia SalesFreddy was seen today for follow-up.  Diagnoses and all orders for this visit:  Solitary pulmonary nodule -     CT CHEST NODULE FOLLOW UP LOW DOSE W/O; Future  Allergic rhinitis, unspecified chronicity, unspecified seasonality, unspecified trigger -     fluticasone (FLONASE) 50 MCG/ACT nasal spray; Place 2 sprays into both nostrils daily.  Cutaneous horn   There are no diagnoses linked to this encounter.  No orders of the defined types were placed in this encounter.   Follow-up: No  Follow-up on file.   Dessa PhiJosalyn Venetta Knee MD

## 2015-12-07 ENCOUNTER — Ambulatory Visit (HOSPITAL_COMMUNITY): Payer: BC Managed Care – PPO

## 2015-12-10 ENCOUNTER — Ambulatory Visit (HOSPITAL_COMMUNITY)
Admission: RE | Admit: 2015-12-10 | Discharge: 2015-12-10 | Disposition: A | Discharge status: Home / Self Care | Payer: BC Managed Care – PPO | Source: Ambulatory Visit | Attending: Family Medicine | Admitting: Family Medicine

## 2015-12-10 DIAGNOSIS — R918 Other nonspecific abnormal finding of lung field: Secondary | ICD-10-CM | POA: Diagnosis not present

## 2015-12-10 DIAGNOSIS — K760 Fatty (change of) liver, not elsewhere classified: Secondary | ICD-10-CM | POA: Insufficient documentation

## 2015-12-10 DIAGNOSIS — R911 Solitary pulmonary nodule: Secondary | ICD-10-CM | POA: Diagnosis present

## 2015-12-15 ENCOUNTER — Telehealth: Payer: Self-pay

## 2015-12-15 NOTE — Telephone Encounter (Signed)
Pt was called and informed of CT results. 

## 2016-05-24 ENCOUNTER — Encounter: Payer: Self-pay | Admitting: Family Medicine

## 2019-09-27 ENCOUNTER — Emergency Department (HOSPITAL_COMMUNITY)
Admission: EM | Admit: 2019-09-27 | Discharge: 2019-09-27 | Disposition: A | Discharge status: Home / Self Care | Payer: BC Managed Care – PPO | Attending: Emergency Medicine | Admitting: Emergency Medicine

## 2019-09-27 ENCOUNTER — Other Ambulatory Visit: Payer: Self-pay

## 2019-09-27 ENCOUNTER — Emergency Department (HOSPITAL_COMMUNITY): Payer: BC Managed Care – PPO

## 2019-09-27 DIAGNOSIS — S29011A Strain of muscle and tendon of front wall of thorax, initial encounter: Secondary | ICD-10-CM | POA: Diagnosis not present

## 2019-09-27 DIAGNOSIS — X501XXA Overexertion from prolonged static or awkward postures, initial encounter: Secondary | ICD-10-CM | POA: Diagnosis not present

## 2019-09-27 DIAGNOSIS — M62838 Other muscle spasm: Secondary | ICD-10-CM

## 2019-09-27 DIAGNOSIS — Z79899 Other long term (current) drug therapy: Secondary | ICD-10-CM | POA: Diagnosis not present

## 2019-09-27 DIAGNOSIS — T148XXA Other injury of unspecified body region, initial encounter: Secondary | ICD-10-CM

## 2019-09-27 MED ORDER — METHOCARBAMOL 500 MG PO TABS: 1000.0000 mg | ORAL_TABLET | Freq: Two times a day (BID) | ORAL | 0 refills | Status: AC

## 2019-09-27 MED ORDER — ACETAMINOPHEN 500 MG PO TABS: 1000.0000 mg | ORAL_TABLET | Freq: Four times a day (QID) | ORAL | 0 refills | Status: AC | PRN

## 2019-09-27 MED ORDER — DICLOFENAC SODIUM 1 % EX GEL: 2.0000 g | Freq: Four times a day (QID) | CUTANEOUS | 0 refills | Status: AC

## 2019-09-27 MED ORDER — OXYCODONE-ACETAMINOPHEN 5-325 MG PO TABS
1.0000 | ORAL_TABLET | Freq: Once | ORAL | Status: AC
Start: 2019-09-27 — End: 2019-09-27
  Administered 2019-09-27: 1 via ORAL
  Filled 2019-09-27: qty 1

## 2019-09-27 NOTE — ED Notes (Signed)
Discharge paperwork reviewed with pt.  Pt informed that his BP was elevated at discharged, encouraged to follow up with PCP regarding elevated BP.  EDP Wylder made aware.

## 2019-09-27 NOTE — Discharge Instructions (Signed)
Your x-ray was negative for any fracture.  This is likely a severe muscle spasm.  Please use warm compresses, warm salt water soaks and Epson salts, drink plenty of water, take Tylenol as prescribed, take Robaxin as prescribed I recommend taking 2 tablets before bedtime to minimize pain severe able to sleep well.  You may also use a topical Voltaren gel which is a topical NSAID.

## 2019-09-27 NOTE — ED Triage Notes (Signed)
Patient reports he was picking up a big log a few days ago and twisted his body when he saw a snake. Patient reports now he has pain in right rib cage area radiating to back. Pain 10/10

## 2019-09-27 NOTE — ED Notes (Signed)
Pt ambulatory from triage 

## 2019-09-27 NOTE — ED Provider Notes (Signed)
[facility]-EMERGENCY DEPT Provider Note   CSN: [id number] Arrival date & time: 09/27/19  1009     History Chief Complaint  Patient presents with  . pain in right rib    Shaun Santana is a 64 y.o. male.  HPI  Patient is a 64 year old male with past medical history significant for BPH no other issues   Patient states that he is here today for right-sided rib cage pain that is 10/10, achy, radiates to his posterior rib cage, almost completely gone when sitting still but significantly worse with twisting or standing up.  He denies any back pain of the midline back.  He denies any trauma but does state that about 3 or 4 days ago he was picking up a heavy wooden log in the yard when he saw a snake and twisted to the right and had severe onset of the pain and symptoms that he is on currently.  He denies any midline back pain, fevers, chills, lightheadedness, dizziness, cancer history, no bowel or bladder incontinence and no saddle anesthesia.  Denies any other pain.  No chest pain or shortness of breath.      No past medical history on file.  Patient Active Problem List   Diagnosis Date Noted  . 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 Allergic rhinitis 11/15/2015  . H pylori ulcer 06/25/2014  . BPH (benign prostatic hyperplasia) 06/25/2014  . Perforated peptic ulcer (HCC) 06/20/2014  . Solitary pulmonary nodule 06/20/2014  . Free intraperitoneal air 06/19/2014    Past Surgical History:  Procedure Laterality Date  . HERNIA REPAIR    . LAPAROSCOPY N/A 06/19/2014   Procedure: LAPAROSCOPIC  REPAIR OF PERFORATED PYLORIC ULCER WITH Peggye Ley;  Surgeon: Gaynelle Adu, MD;  Location: [facility] OR;  Service: General;  Laterality: N/A;       No family history on file.  Social History   Tobacco Use  . Smoking status: Never Smoker  Substance Use Topics  . Alcohol use: Yes    Comment: 1 pint every weekend  . Drug use: Yes    Types: Marijuana    Home Medications Prior  to Admission medications   Medication Sig Start Date End Date Taking? Authorizing Provider  acetaminophen (TYLENOL) 500 MG tablet Take 2 tablets (1,000 mg total) by mouth every 6 (six) hours as needed for mild pain. 09/27/19   Gailen Shelter, PA  diclofenac Sodium (VOLTAREN) 1 % GEL Apply 2 g topically 4 (four) times daily. 09/27/19   Tyreka Henneke, Rodrigo Ran, PA  fluticasone (FLONASE) 50 MCG/ACT nasal spray Place 2 sprays into both nostrils daily. 11/15/15   Funches, Gerilyn Nestle, MD  methocarbamol (ROBAXIN) 500 MG tablet Take 2 tablets (1,000 mg total) by mouth 2 (two) times daily for 7 days. 09/27/19 10/04/19  Gailen Shelter, PA    Allergies    Nsaids  Review of Systems   Review of Systems  Constitutional: Negative for chills and fever.  HENT: Negative for congestion.   Respiratory: Negative for shortness of breath.   Cardiovascular: Negative for chest pain.  Gastrointestinal: Negative for abdominal pain.  Musculoskeletal: Negative for neck pain.       Rib pain right    Physical Exam Updated Vital Signs BP (!) 170/95 (BP Location: Left Arm)   Pulse 68   Temp 98.1 F (36.7 C) (Oral)   Resp 18   Ht 5\' 11"  (1.803 m)   Wt 111.1 kg   SpO2 94%   BMI 34.17 kg/m  Physical Exam Vitals and nursing note reviewed.  Constitutional:      General: He is not in acute distress.    Appearance: Normal appearance. He is not ill-appearing.  HENT:     Head: Normocephalic and atraumatic.  Eyes:     General: No scleral icterus.       Right eye: No discharge.        Left eye: No discharge.     Conjunctiva/sclera: Conjunctivae normal.  Cardiovascular:     Rate and Rhythm: Normal rate and regular rhythm.     Pulses: Normal pulses.     Heart sounds: Normal heart sounds.  Pulmonary:     Effort: Pulmonary effort is normal.     Breath sounds: No stridor. No wheezing or rales.     Comments: Lung sounds in all fields. Musculoskeletal:     Comments: Significant tenderness to palpation of the right  lateral chest wall.  This reproduces the patient's symptoms.  Patient flinches with discomfort when these ribs are touched.  There is no bruising step-off or deformity.  Skin:    General: Skin is warm and dry.  Neurological:     Mental Status: He is alert and oriented to person, place, and time. Mental status is at baseline.     ED Results / Procedures / Treatments   Labs (all labs ordered are listed, but only abnormal results are displayed) Labs Reviewed - No data to display  EKG None  Radiology DG Ribs Unilateral W/Chest Right  Result Date: 09/27/2019 CLINICAL DATA:  Pain of the RIGHT side EXAM: RIGHT RIBS AND CHEST - 3+ VIEW COMPARISON:  None. FINDINGS: No fracture or other bone lesions are seen involving the ribs. There is no evidence of pneumothorax or pleural effusion. Both lungs are clear. Heart size and mediastinal contours are within normal limits. IMPRESSION: No acute displaced rib fracture. Electronically Signed   By: Meda Klinefelter MD   On: 09/27/2019 13:26    Procedures Procedures (including critical care time)  Medications Ordered in ED Medications  oxyCODONE-acetaminophen (PERCOCET/ROXICET) 5-325 MG per tablet 1 tablet (1 tablet Oral Given 09/27/19 1307)    ED Course  I have reviewed the triage vital signs and the nursing notes.  Pertinent labs & imaging results that were available during my care of the patient were reviewed by me and considered in my medical decision making (see chart for details).    MDM Rules/Calculators/A&P                          Patient is a well-appearing 64 year old male with no pertinent past medical history today's visit.  Presented today with right rib cage pain after he twisted abnormally when he was suppressed by a snake several days ago.  He has had achy constant pain since that time.  Physical exam is notable for significant tenderness palpation of the right lateral rib cage.  Lungs clear in all fields.  Heart and lung exam is  within normal limits and pulses in all extremities.  He has no chest pain or shortness of breath.  He has no other symptoms today.  Overall well-appearing.  X-ray obtained by myself to rule out any fracture given that this would be an unusual mechanism.  Low suspicion for cancer/pathologic fracture.  X-ray reviewed by myself.  There are no fractures.  I agree with the radiologist read.  Discharged with Robaxin, Voltaren gel and Tylenol.  He will follow-up with his PCP.  Given return precautions.  Final Clinical Impression(s) / ED Diagnoses Final diagnoses:  Muscle strain  Muscle spasm    Rx / DC Orders ED Discharge Orders         Ordered    methocarbamol (ROBAXIN) 500 MG tablet  2 times daily        09/27/19 1331    acetaminophen (TYLENOL) 500 MG tablet  Every 6 hours PRN        09/27/19 1331    diclofenac Sodium (VOLTAREN) 1 % GEL  4 times daily        09/27/19 1331           Solon Augusta Novelty, [place] 09/27/19 1335    Gwyneth Sprout, MD 09/27/19 1423

## 2021-11-19 ENCOUNTER — Encounter (HOSPITAL_COMMUNITY): Payer: Self-pay | Admitting: Emergency Medicine

## 2021-11-19 ENCOUNTER — Emergency Department (HOSPITAL_COMMUNITY): Payer: Medicare HMO

## 2021-11-19 ENCOUNTER — Other Ambulatory Visit: Payer: Self-pay

## 2021-11-19 ENCOUNTER — Emergency Department (HOSPITAL_COMMUNITY)
Admission: EM | Admit: 2021-11-19 | Discharge: 2021-11-19 | Disposition: A | Discharge status: Home / Self Care | Payer: Medicare HMO | Attending: Emergency Medicine | Admitting: Emergency Medicine

## 2021-11-19 DIAGNOSIS — R109 Unspecified abdominal pain: Secondary | ICD-10-CM | POA: Diagnosis not present

## 2021-11-19 DIAGNOSIS — R079 Chest pain, unspecified: Secondary | ICD-10-CM | POA: Diagnosis not present

## 2021-11-19 DIAGNOSIS — M25511 Pain in right shoulder: Secondary | ICD-10-CM | POA: Insufficient documentation

## 2021-11-19 DIAGNOSIS — R001 Bradycardia, unspecified: Secondary | ICD-10-CM | POA: Diagnosis not present

## 2021-11-19 DIAGNOSIS — K760 Fatty (change of) liver, not elsewhere classified: Secondary | ICD-10-CM | POA: Diagnosis not present

## 2021-11-19 DIAGNOSIS — N281 Cyst of kidney, acquired: Secondary | ICD-10-CM | POA: Diagnosis not present

## 2021-11-19 DIAGNOSIS — M546 Pain in thoracic spine: Secondary | ICD-10-CM | POA: Diagnosis not present

## 2021-11-19 LAB — COMPREHENSIVE METABOLIC PANEL
ALT: 25 U/L (ref 0–44)
AST: 27 U/L (ref 15–41)
Albumin: 3.9 g/dL (ref 3.5–5.0)
Alkaline Phosphatase: 60 U/L (ref 38–126)
Anion gap: 13 (ref 5–15)
BUN: 10 mg/dL (ref 8–23)
CO2: 25 mmol/L (ref 22–32)
Calcium: 9.5 mg/dL (ref 8.9–10.3)
Chloride: 102 mmol/L (ref 98–111)
Creatinine, Ser: 0.85 mg/dL (ref 0.61–1.24)
GFR, Estimated: >60 mL/min (ref >60)
Glucose, Bld: 117 mg/dL — ABNORMAL HIGH (ref 70–99)
Potassium: 3.9 mmol/L (ref 3.5–5.1)
Sodium: 140 mmol/L (ref 135–145)
Total Bilirubin: 0.3 mg/dL (ref 0.3–1.2)
Total Protein: 7.2 g/dL (ref 6.5–8.1)

## 2021-11-19 LAB — CBC WITH DIFFERENTIAL/PLATELET
Abs Immature Granulocytes: 0.02 10*3/uL (ref 0.00–0.07)
Basophils Absolute: 0 10*3/uL (ref 0.0–0.1)
Basophils Relative: 1 %
Eosinophils Absolute: 0.1 10*3/uL (ref 0.0–0.5)
Eosinophils Relative: 2 %
HCT: 43.9 % (ref 39.0–52.0)
Hemoglobin: 14.2 g/dL (ref 13.0–17.0)
Immature Granulocytes: 0 %
Lymphocytes Relative: 35 %
Lymphs Abs: 1.9 10*3/uL (ref 0.7–4.0)
MCH: 29.5 pg (ref 26.0–34.0)
MCHC: 32.3 g/dL (ref 30.0–36.0)
MCV: 91.1 fL (ref 80.0–100.0)
Monocytes Absolute: 0.6 10*3/uL (ref 0.1–1.0)
Monocytes Relative: 11 %
Neutro Abs: 2.7 10*3/uL (ref 1.7–7.7)
Neutrophils Relative %: 51 %
Platelets: 174 10*3/uL (ref 150–400)
RBC: 4.82 MIL/uL (ref 4.22–5.81)
RDW: 12.5 % (ref 11.5–15.5)
WBC: 5.3 10*3/uL (ref 4.0–10.5)
nRBC: 0 % (ref 0.0–0.2)

## 2021-11-19 LAB — URINALYSIS, ROUTINE W REFLEX MICROSCOPIC
Bilirubin Urine: NEGATIVE
Glucose, UA: NEGATIVE mg/dL
Hgb urine dipstick: NEGATIVE
Ketones, ur: NEGATIVE mg/dL
Leukocytes,Ua: NEGATIVE
Nitrite: NEGATIVE
Protein, ur: NEGATIVE mg/dL
Specific Gravity, Urine: 1.015 (ref 1.005–1.030)
pH: 7 (ref 5.0–8.0)

## 2021-11-19 LAB — LIPASE, BLOOD: Lipase: 34 U/L (ref 11–51)

## 2021-11-19 LAB — TROPONIN I (HIGH SENSITIVITY)
Troponin I (High Sensitivity): 6 ng/L (ref <18)
Troponin I (High Sensitivity): 6 ng/L (ref <18)

## 2021-11-19 MED ORDER — IOHEXOL 350 MG/ML SOLN
75.0000 mL | Freq: Once | INTRAVENOUS | Status: AC | PRN
  Administered 2021-11-19: 75 mL via INTRAVENOUS

## 2021-11-19 MED ORDER — KETOROLAC TROMETHAMINE 15 MG/ML IJ SOLN
15.0000 mg | Freq: Once | INTRAMUSCULAR | Status: AC
  Administered 2021-11-19: 15 mg via INTRAVENOUS
  Filled 2021-11-19: qty 1

## 2021-11-19 MED ORDER — METHOCARBAMOL 500 MG PO TABS
1000.0000 mg | ORAL_TABLET | Freq: Once | ORAL | Status: AC
  Administered 2021-11-19: 1000 mg via ORAL
  Filled 2021-11-19: qty 2

## 2021-11-19 MED ORDER — METHOCARBAMOL 500 MG PO TABS: 1000.0000 mg | ORAL_TABLET | Freq: Three times a day (TID) | ORAL | 0 refills | Status: AC | PRN

## 2021-11-19 NOTE — ED Provider Triage Note (Addendum)
Emergency Medicine Provider Triage Evaluation Note  Shaun Santana , a 66 y.o. male  was evaluated in triage.  Pt complains of diffuse right sided pain. Located to posterior chest, abd, flank and into lower back. Pain x 1 week. No numbness or weakness. No recent injury or trauma. No SOB, dysuria, hematuria. Pain worse at night when laying flat. Pain not worse with exertion, deep breathing or eating.  Hx of perforated ulcer. When asked if feels similar states "kind of, but not really." Appears comfortable in room.  Review of Systems  Positive: Flank, posterior chest, lower back pain Negative: Fever, emesis, SOB  Physical Exam  BP (!) 159/112   Pulse 66   Temp 98.1 F (36.7 C)   Resp 20   SpO2 96%  Gen:   Awake, no distress   Resp:  Normal effort  MSK:   Moves extremities without difficulty. No midline tenderness Other:    Medical Decision Making  Medically screening exam initiated at 8:18 AM.  Appropriate orders placed.  Shaun Santana was informed that the remainder of the evaluation will be completed by another provider, this initial triage assessment does not replace that evaluation, and the importance of remaining in the ED until their evaluation is complete.  Pain      Arlina Sabina A, PA-C 11/19/21 [id number]

## 2021-11-19 NOTE — ED Provider Notes (Signed)
[facility] EMERGENCY DEPARTMENT Provider Note   CSN: [id number] Arrival date & time: 11/19/21  0801     History  Chief Complaint  Patient presents with   Back Pain   Shoulder Pain    Shaun Santana is a 66 y.o. male.   Back Pain Shoulder Pain Associated symptoms: back pain   Patient presents for back and shoulder pain.  Medical history includes PUD, BPH.  Typically, he takes Flomax only for BPH.  Over the past week, he has had areas of pain in his back and right shoulder.  He denies any recent injuries.  Pain is primarily located in right upper trapezius area and mid back right paraspinous area.  He has taken Tylenol Advil at home.  He has had minimal relief from this.  Symptoms typically worsen at night as he is laying down.  It is difficult for him to find a comfortable position.  This is caused loss of sleep.     Home Medications Prior to Admission medications   Medication Sig Start Date End Date Taking? Authorizing Provider  methocarbamol (ROBAXIN) 500 MG tablet Take 2 tablets (1,000 mg total) by mouth every 8 (eight) hours as needed for muscle spasms. 11/19/21  Yes Gloris Manchester, MD  acetaminophen (TYLENOL) 500 MG tablet Take 2 tablets (1,000 mg total) by mouth every 6 (six) hours as needed for mild pain. 09/27/19   Gailen Shelter, PA  diclofenac Sodium (VOLTAREN) 1 % GEL Apply 2 g topically 4 (four) times daily. 09/27/19   Fondaw, Rodrigo Ran, PA  fluticasone (FLONASE) 50 MCG/ACT nasal spray Place 2 sprays into both nostrils daily. 11/15/15   Dessa Phi, MD      Allergies    Nsaids    Review of Systems   Review of Systems  Musculoskeletal:  Positive for back pain and myalgias.  All other systems reviewed and are negative.   Physical Exam Updated Vital Signs BP (!) 147/77   Pulse 61   Temp 98.2 F (36.8 C)   Resp 18   SpO2 100%  Physical Exam Vitals and nursing note reviewed.  Constitutional:      General: He is not in acute  distress.    Appearance: Normal appearance. He is well-developed. He is not ill-appearing, toxic-appearing or diaphoretic.  HENT:     Head: Normocephalic and atraumatic.     Right Ear: External ear normal.     Left Ear: External ear normal.     Nose: Nose normal.     Mouth/Throat:     Mouth: Mucous membranes are moist.     Pharynx: Oropharynx is clear.  Eyes:     Extraocular Movements: Extraocular movements intact.     Conjunctiva/sclera: Conjunctivae normal.  Cardiovascular:     Rate and Rhythm: Normal rate and regular rhythm.     Heart sounds: No murmur heard. Pulmonary:     Effort: Pulmonary effort is normal. No respiratory distress.  Abdominal:     General: There is no distension.     Palpations: Abdomen is soft.  Musculoskeletal:        General: Tenderness (Areas of the paraspinous and trapezius musculature of right back) present. No swelling or deformity.     Cervical back: Normal range of motion and neck supple.  Skin:    General: Skin is warm and dry.     Capillary Refill: Capillary refill takes less than 2 seconds.     Coloration: Skin is not jaundiced or pale.  Neurological:     General: No focal deficit present.     Mental Status: He is alert and oriented to person, place, and time.     Cranial Nerves: No cranial nerve deficit.     Sensory: No sensory deficit.     Motor: No weakness.     Coordination: Coordination normal.  Psychiatric:        Mood and Affect: Mood normal.        Behavior: Behavior normal.        Thought Content: Thought content normal.        Judgment: Judgment normal.     ED Results / Procedures / Treatments   Labs (all labs ordered are listed, but only abnormal results are displayed) Labs Reviewed  COMPREHENSIVE METABOLIC PANEL - Abnormal; Notable for the following components:      Result Value   Glucose, Bld 117 (*)    All other components within normal limits  CBC WITH DIFFERENTIAL/PLATELET  LIPASE, BLOOD  URINALYSIS, ROUTINE W  REFLEX MICROSCOPIC  TROPONIN I (HIGH SENSITIVITY)  TROPONIN I (HIGH SENSITIVITY)    EKG EKG Interpretation  Date/Time:  Saturday November 19 2021 08:16:13 EST Ventricular Rate:  59 PR Interval:  212 QRS Duration: 94 QT Interval:  416 QTC Calculation: 411 R Axis:   75 Text Interpretation: Sinus bradycardia with 1st degree A-V block Confirmed by Gloris Manchesterixon, Jenessa Gillingham ([id number]) on 11/19/2021 1:42:53 PM  Radiology CT Angio Chest PE W and/or Wo Contrast  Result Date: 11/19/2021 CLINICAL DATA:  Chest pain. Flank pain. Acute, non localized abdominal pain. EXAM: CT ANGIOGRAPHY CHEST CT ABDOMEN AND PELVIS WITH CONTRAST TECHNIQUE: Multidetector CT imaging of the chest was performed using the standard protocol during bolus administration of intravenous contrast. Multiplanar CT image reconstructions and MIPs were obtained to evaluate the vascular anatomy. Multidetector CT imaging of the abdomen and pelvis was performed using the standard protocol during bolus administration of intravenous contrast. RADIATION DOSE REDUCTION: This exam was performed according to the departmental dose-optimization program which includes automated exposure control, adjustment of the mA and/or kV according to patient size and/or use of iterative reconstruction technique. CONTRAST:  75mL OMNIPAQUE IOHEXOL 350 MG/ML SOLN COMPARISON:  Acute abdomen series obtained earlier today. Chest CT dated 12/11/2015. Abdomen and pelvis CT dated 06/19/2014 and 05/18/1998. FINDINGS: CTA CHEST FINDINGS Cardiovascular: Mildly enlarged heart. Normally opacified pulmonary arteries with no pulmonary arterial filling defects seen. Mediastinum/Nodes: No enlarged mediastinal, hilar, or axillary lymph nodes. Thyroid gland, trachea, and esophagus demonstrate no significant findings. Lungs/Pleura: Mild linear atelectasis or scarring at both lung bases. Mild biapical pleural and parenchymal scarring. 3 mm nodule in the left lower lobe on image 9/5. This is unchanged  since 06/19/2014 and does not need imaging follow-up. There is also a 3 mm nodule in the posterior left upper lobe on image number 46/6 which is unchanged since 12/10/2015. This is also compatible with a benign nodule not needing imaging follow-up. Musculoskeletal: Thoracic spine degenerative changes, including changes of DISH. Lower cervical spine degenerative changes. Review of the MIP images confirms the above findings. CT ABDOMEN and PELVIS FINDINGS Hepatobiliary: Mild diffuse low density of the liver, without significant change. Unremarkable gallbladder. Pancreas: Unremarkable. No pancreatic ductal dilatation or surrounding inflammatory changes. Spleen: Normal in size without focal abnormality. Adrenals/Urinary Tract: Normal appearing adrenal glands. Small right renal cortical cyst and small left renal parapelvic cysts. These do not need imaging follow-up. Unremarkable urinary bladder and ureters. No urinary tract calculi or hydronephrosis. Stomach/Bowel: Stomach is within normal  limits. Appendix appears normal. No evidence of bowel wall thickening, distention, or inflammatory changes. Vascular/Lymphatic: Mild atheromatous arterial calcifications. No enlarged lymph nodes. Reproductive: Minimally enlarged prostate gland. Other: Small umbilical hernia containing fat and small bilateral inguinal hernias containing fat. Musculoskeletal: Mild lumbar spine degenerative changes. Review of the MIP images confirms the above findings. IMPRESSION: 1. No pulmonary emboli or acute abnormality in the chest, abdomen or pelvis. 2. Stable mild diffuse hepatic steatosis. Electronically Signed   By: Beckie Salts M.D.   On: 11/19/2021 12:53   CT ABDOMEN PELVIS W CONTRAST  Result Date: 11/19/2021 CLINICAL DATA:  Chest pain. Flank pain. Acute, non localized abdominal pain. EXAM: CT ANGIOGRAPHY CHEST CT ABDOMEN AND PELVIS WITH CONTRAST TECHNIQUE: Multidetector CT imaging of the chest was performed using the standard protocol  during bolus administration of intravenous contrast. Multiplanar CT image reconstructions and MIPs were obtained to evaluate the vascular anatomy. Multidetector CT imaging of the abdomen and pelvis was performed using the standard protocol during bolus administration of intravenous contrast. RADIATION DOSE REDUCTION: This exam was performed according to the departmental dose-optimization program which includes automated exposure control, adjustment of the mA and/or kV according to patient size and/or use of iterative reconstruction technique. CONTRAST:  66mL OMNIPAQUE IOHEXOL 350 MG/ML SOLN COMPARISON:  Acute abdomen series obtained earlier today. Chest CT dated 12/11/2015. Abdomen and pelvis CT dated 06/19/2014 and 05/18/1998. FINDINGS: CTA CHEST FINDINGS Cardiovascular: Mildly enlarged heart. Normally opacified pulmonary arteries with no pulmonary arterial filling defects seen. Mediastinum/Nodes: No enlarged mediastinal, hilar, or axillary lymph nodes. Thyroid gland, trachea, and esophagus demonstrate no significant findings. Lungs/Pleura: Mild linear atelectasis or scarring at both lung bases. Mild biapical pleural and parenchymal scarring. 3 mm nodule in the left lower lobe on image 9/5. This is unchanged since 06/19/2014 and does not need imaging follow-up. There is also a 3 mm nodule in the posterior left upper lobe on image number 46/6 which is unchanged since 12/10/2015. This is also compatible with a benign nodule not needing imaging follow-up. Musculoskeletal: Thoracic spine degenerative changes, including changes of DISH. Lower cervical spine degenerative changes. Review of the MIP images confirms the above findings. CT ABDOMEN and PELVIS FINDINGS Hepatobiliary: Mild diffuse low density of the liver, without significant change. Unremarkable gallbladder. Pancreas: Unremarkable. No pancreatic ductal dilatation or surrounding inflammatory changes. Spleen: Normal in size without focal abnormality.  Adrenals/Urinary Tract: Normal appearing adrenal glands. Small right renal cortical cyst and small left renal parapelvic cysts. These do not need imaging follow-up. Unremarkable urinary bladder and ureters. No urinary tract calculi or hydronephrosis. Stomach/Bowel: Stomach is within normal limits. Appendix appears normal. No evidence of bowel wall thickening, distention, or inflammatory changes. Vascular/Lymphatic: Mild atheromatous arterial calcifications. No enlarged lymph nodes. Reproductive: Minimally enlarged prostate gland. Other: Small umbilical hernia containing fat and small bilateral inguinal hernias containing fat. Musculoskeletal: Mild lumbar spine degenerative changes. Review of the MIP images confirms the above findings. IMPRESSION: 1. No pulmonary emboli or acute abnormality in the chest, abdomen or pelvis. 2. Stable mild diffuse hepatic steatosis. Electronically Signed   By: Beckie Salts M.D.   On: 11/19/2021 12:53   DG Abdomen Acute W/Chest  Result Date: 11/19/2021 CLINICAL DATA:  Diffuse right-sided pain localizing to posterior chest, abdomen, and flank EXAM: DG ABDOMEN ACUTE WITH 1 VIEW CHEST COMPARISON:  Chest x-ray September 27, 2019, chest CT December 10, 2015 FINDINGS: There is mild respiratory motion artifact. There is no evidence of dilated bowel loops or free intraperitoneal air. No radiopaque calculi  or other significant radiographic abnormality is seen. Heart size and mediastinal contours are within normal limits. Both lungs are clear. IMPRESSION: Negative abdominal radiographs.  No acute cardiopulmonary disease. Electronically Signed   By: Jacob Moores M.D.   On: 11/19/2021 09:01    Procedures Procedures    Medications Ordered in ED Medications  iohexol (OMNIPAQUE) 350 MG/ML injection 75 mL (75 mLs Intravenous Contrast Given 11/19/21 1211)  ketorolac (TORADOL) 15 MG/ML injection 15 mg (15 mg Intravenous Given 11/19/21 1434)  methocarbamol (ROBAXIN) tablet 1,000 mg  (1,000 mg Oral Given 11/19/21 1433)    ED Course/ Medical Decision Making/ A&P                           Medical Decision Making Risk Prescription drug management.   Patient presents for back and right shoulder pain.  He has no recent injuries.  Symptoms have been intermittent over the past week.  They seem to be worse at night as he is trying to find a comfortable position to lay in bed.  He has used Tylenol and Advil at home with minimal relief.  Due to the persistence of symptoms, patient presents to the emergency department.  Prior to being bedded in the ED, patient underwent diagnostic work-up.  Lab results showed normal electrolytes, normal troponin, no leukocytosis.  He has no evidence of myoglobinuria on UA.  He underwent CTA chest and CT of abdomen and pelvis which did not show any acute findings.  On exam, patient is well-appearing.  He has areas of muscular tenderness in upper right trapezius and thoracic paraspinous areas on the right.  Patient was treated for musculoskeletal pain with Toradol and Robaxin.  On reassessment, patient has significant relief of his discomfort.  He was prescribed Robaxin to take at home as needed.  He was advised to continue Tylenol.  Given his history of PUD, patient to limit use of NSAIDs.  Patient was discharged in good condition.        Final Clinical Impression(s) / ED Diagnoses Final diagnoses:  Acute right-sided thoracic back pain    Rx / DC Orders ED Discharge Orders          Ordered    methocarbamol (ROBAXIN) 500 MG tablet  Every 8 hours PRN        11/19/21 1504              Gloris Manchester, MD 11/19/21 1506

## 2021-11-19 NOTE — Discharge Instructions (Signed)
A prescription for a muscle relaxer was sent to your pharmacy.  Take this as needed.  Continue to take Tylenol for treatment of your pain as well.  Return to emergency department for any new or worsening symptoms of concern.

## 2021-11-19 NOTE — ED Triage Notes (Signed)
Patient here w/ R shoulder pain radiating to his lower back that has been ongoing for a week. States the pain has been so severe over the last few nights he hasn't been able to sleep. Patient denies any associated trauma. Denies urinary symptoms, chest pain, SHOB.  Describes it as a throbbing pain.

## 2022-03-02 DIAGNOSIS — Z1211 Encounter for screening for malignant neoplasm of colon: Secondary | ICD-10-CM | POA: Diagnosis not present

## 2022-03-02 DIAGNOSIS — M546 Pain in thoracic spine: Secondary | ICD-10-CM | POA: Diagnosis not present

## 2022-03-02 DIAGNOSIS — I10 Essential (primary) hypertension: Secondary | ICD-10-CM | POA: Diagnosis not present

## 2022-03-02 DIAGNOSIS — F1090 Alcohol use, unspecified, uncomplicated: Secondary | ICD-10-CM | POA: Diagnosis not present

## 2022-03-02 DIAGNOSIS — K760 Fatty (change of) liver, not elsewhere classified: Secondary | ICD-10-CM | POA: Diagnosis not present

## 2022-03-02 DIAGNOSIS — Z79899 Other long term (current) drug therapy: Secondary | ICD-10-CM | POA: Diagnosis not present

## 2022-03-02 DIAGNOSIS — N400 Enlarged prostate without lower urinary tract symptoms: Secondary | ICD-10-CM | POA: Diagnosis not present

## 2022-03-02 DIAGNOSIS — Z87898 Personal history of other specified conditions: Secondary | ICD-10-CM | POA: Diagnosis not present

## 2022-05-29 IMAGING — CR DG RIBS W/ CHEST 3+V*R*
4 series · 4 of 4 positions shown · non-contrast
Comparison: None.

CLINICAL DATA: Pain of the RIGHT side

EXAM:
RIGHT RIBS AND CHEST - 3+ VIEW

[w chest pa]
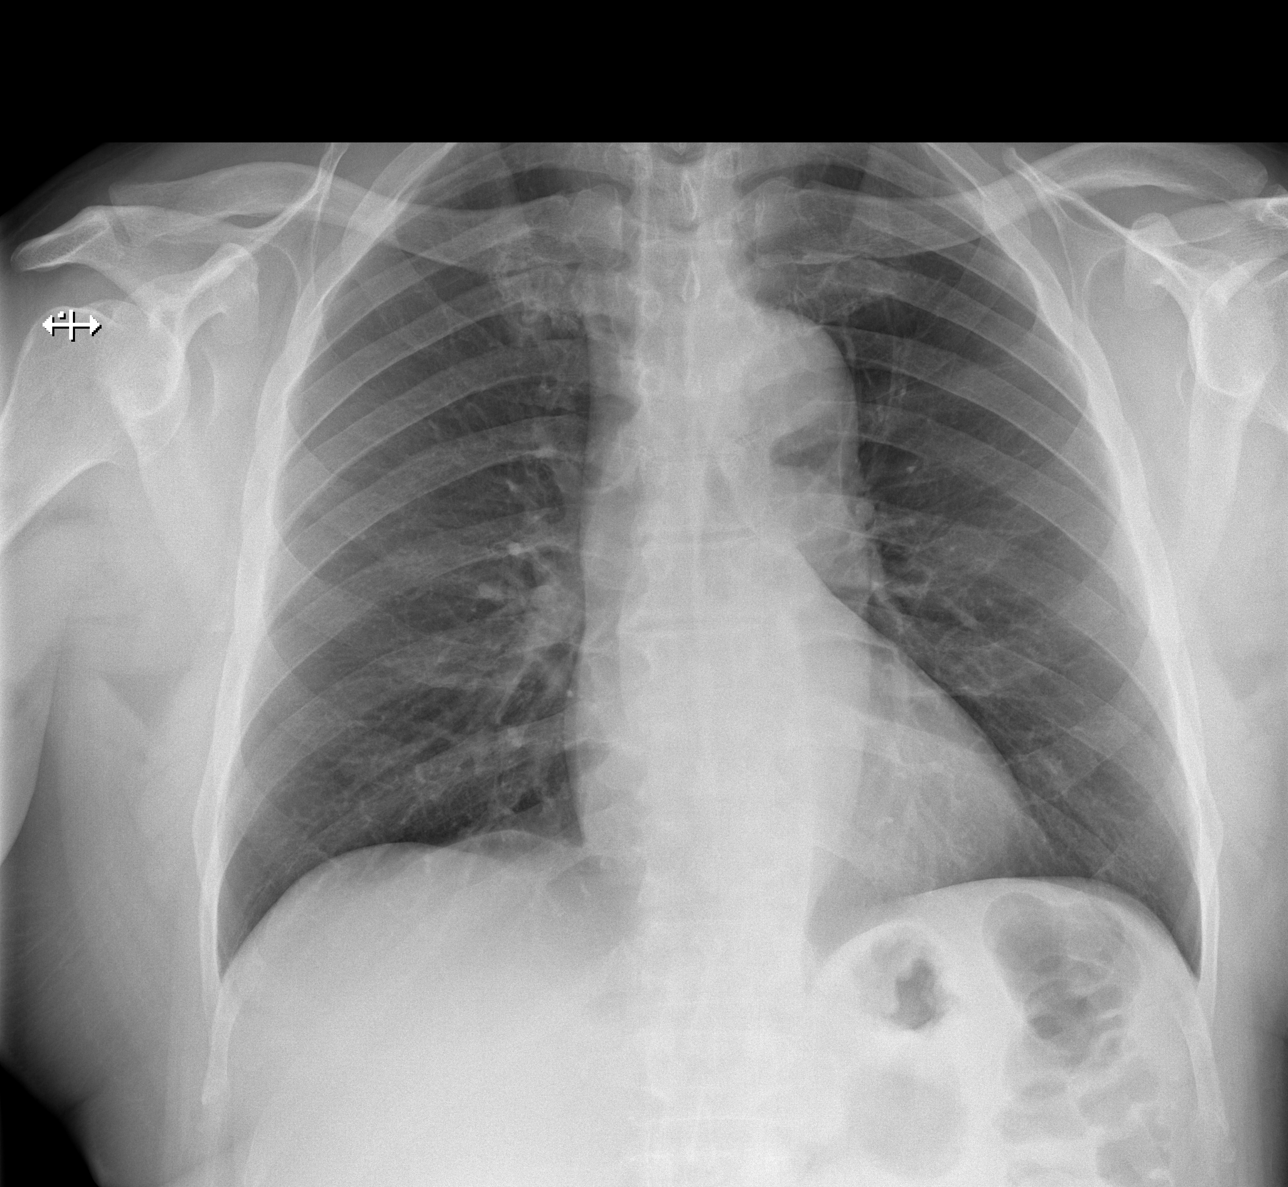

[w ribs ap upper right]
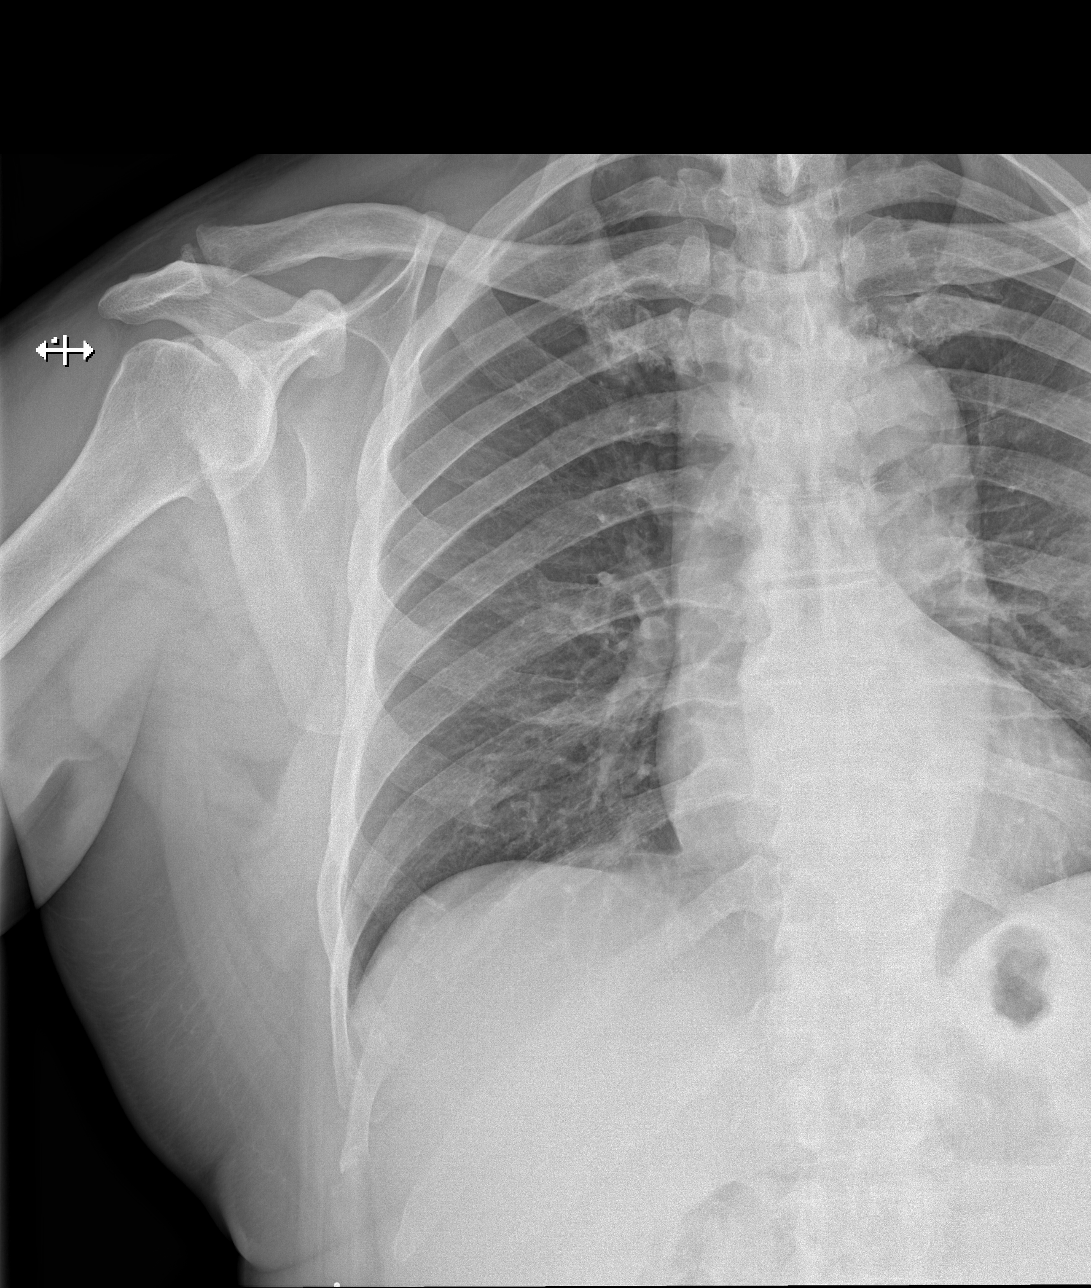

[w ribs ap lower right]
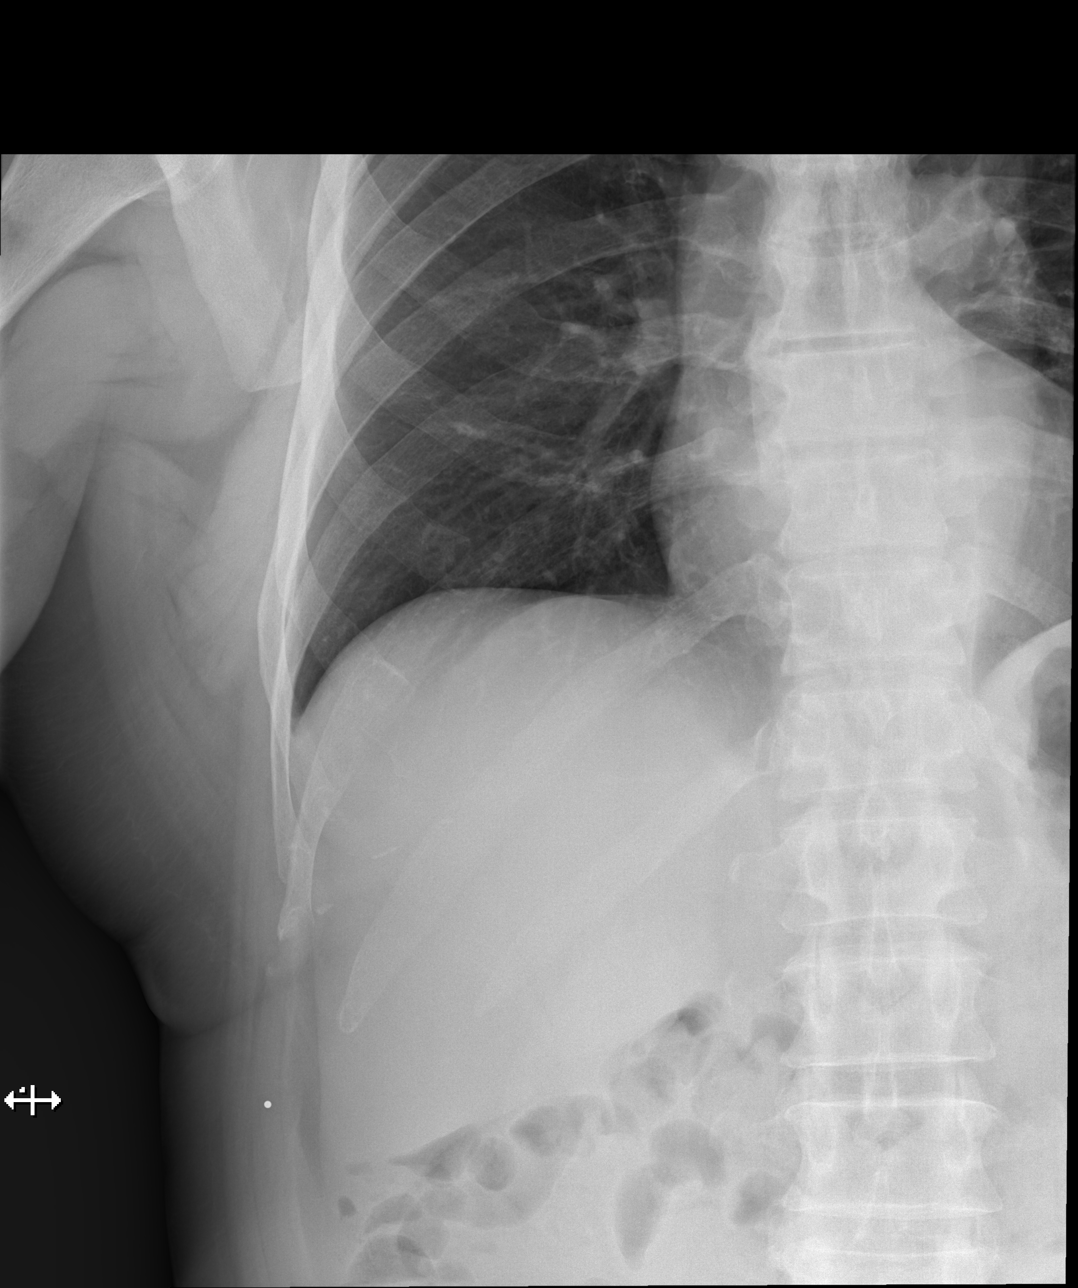

[w ribs obl right]
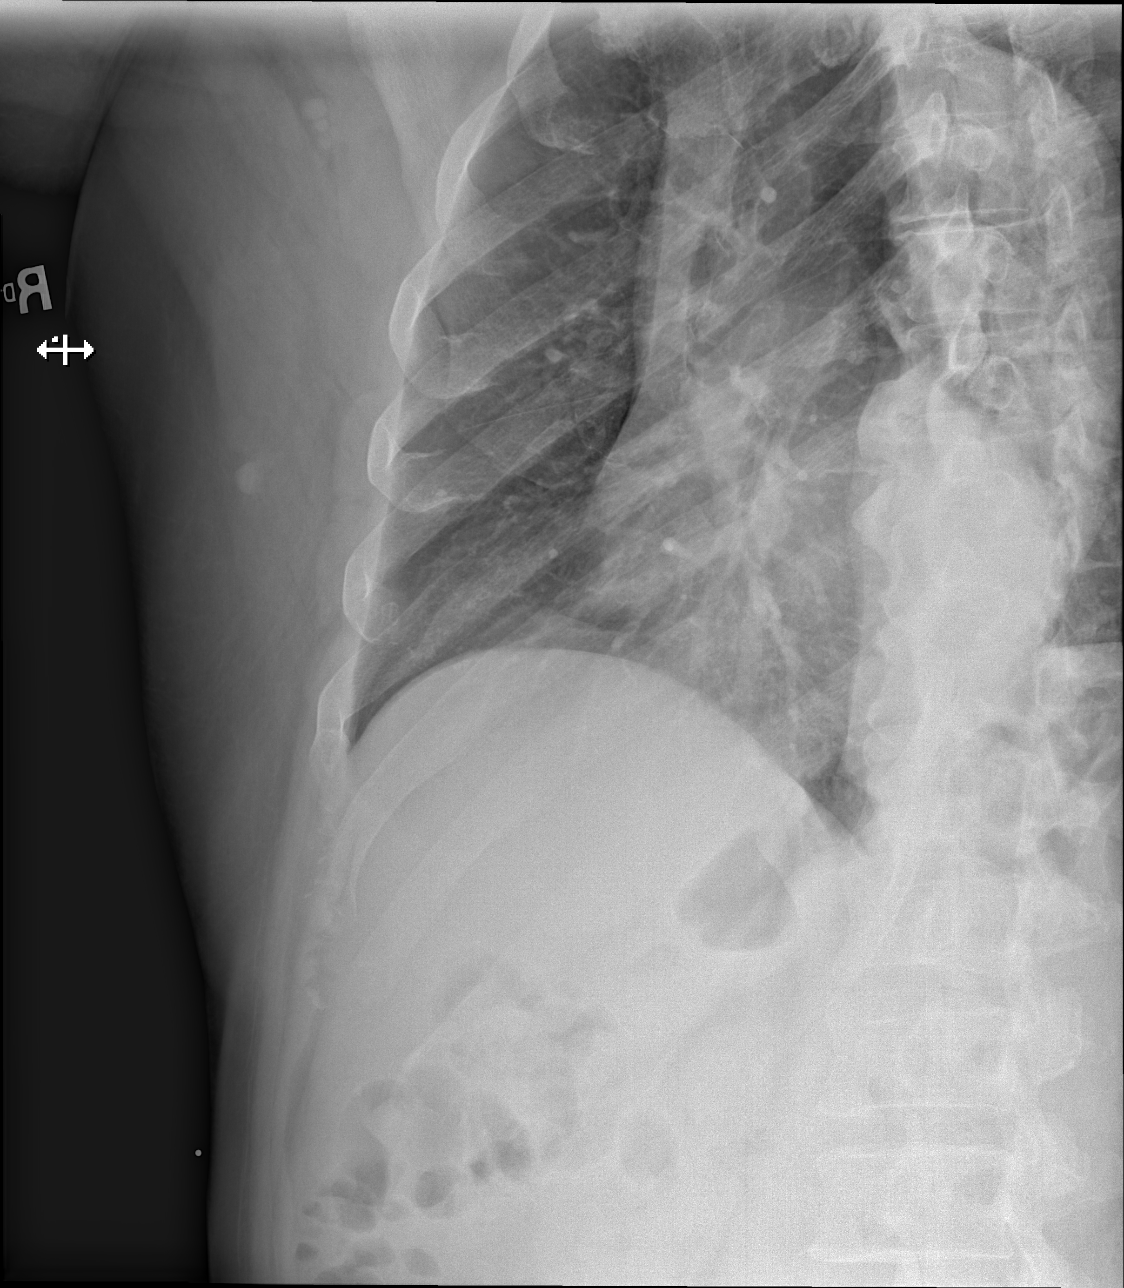

[4 of 4 positions shown; findings below may reference images not displayed]

FINDINGS: No fracture or other bone lesions are seen involving the ribs. There
is no evidence of pneumothorax or pleural effusion. Both lungs are
clear. Heart size and mediastinal contours are within normal limits.
IMPRESSION: No acute displaced rib fracture.

## 2022-09-15 DIAGNOSIS — H903 Sensorineural hearing loss, bilateral: Secondary | ICD-10-CM | POA: Diagnosis not present

## 2022-11-07 DIAGNOSIS — N529 Male erectile dysfunction, unspecified: Secondary | ICD-10-CM | POA: Diagnosis not present

## 2022-11-07 DIAGNOSIS — N400 Enlarged prostate without lower urinary tract symptoms: Secondary | ICD-10-CM | POA: Diagnosis not present

## 2022-11-07 DIAGNOSIS — F1090 Alcohol use, unspecified, uncomplicated: Secondary | ICD-10-CM | POA: Diagnosis not present

## 2022-11-07 DIAGNOSIS — I10 Essential (primary) hypertension: Secondary | ICD-10-CM | POA: Diagnosis not present

## 2022-11-07 DIAGNOSIS — K760 Fatty (change of) liver, not elsewhere classified: Secondary | ICD-10-CM | POA: Diagnosis not present

## 2022-11-07 DIAGNOSIS — Z87898 Personal history of other specified conditions: Secondary | ICD-10-CM | POA: Diagnosis not present

## 2022-11-07 DIAGNOSIS — R739 Hyperglycemia, unspecified: Secondary | ICD-10-CM | POA: Diagnosis not present

## 2023-01-05 DIAGNOSIS — Z23 Encounter for immunization: Secondary | ICD-10-CM | POA: Diagnosis not present

## 2023-01-05 DIAGNOSIS — N400 Enlarged prostate without lower urinary tract symptoms: Secondary | ICD-10-CM | POA: Diagnosis not present

## 2023-01-05 DIAGNOSIS — R739 Hyperglycemia, unspecified: Secondary | ICD-10-CM | POA: Diagnosis not present

## 2023-01-05 DIAGNOSIS — I10 Essential (primary) hypertension: Secondary | ICD-10-CM | POA: Diagnosis not present

## 2023-01-19 DIAGNOSIS — R739 Hyperglycemia, unspecified: Secondary | ICD-10-CM | POA: Diagnosis not present

## 2023-01-19 DIAGNOSIS — I10 Essential (primary) hypertension: Secondary | ICD-10-CM | POA: Diagnosis not present

## 2023-03-21 DIAGNOSIS — H40023 Open angle with borderline findings, high risk, bilateral: Secondary | ICD-10-CM | POA: Diagnosis not present

## 2023-03-21 DIAGNOSIS — H16143 Punctate keratitis, bilateral: Secondary | ICD-10-CM | POA: Diagnosis not present

## 2023-03-23 DIAGNOSIS — R7303 Prediabetes: Secondary | ICD-10-CM | POA: Diagnosis not present

## 2023-03-23 DIAGNOSIS — N400 Enlarged prostate without lower urinary tract symptoms: Secondary | ICD-10-CM | POA: Diagnosis not present

## 2023-03-23 DIAGNOSIS — E559 Vitamin D deficiency, unspecified: Secondary | ICD-10-CM | POA: Diagnosis not present

## 2023-03-23 DIAGNOSIS — Z125 Encounter for screening for malignant neoplasm of prostate: Secondary | ICD-10-CM | POA: Diagnosis not present

## 2023-03-23 DIAGNOSIS — I10 Essential (primary) hypertension: Secondary | ICD-10-CM | POA: Diagnosis not present

## 2023-03-23 DIAGNOSIS — Z1211 Encounter for screening for malignant neoplasm of colon: Secondary | ICD-10-CM | POA: Diagnosis not present

## 2023-03-23 DIAGNOSIS — N529 Male erectile dysfunction, unspecified: Secondary | ICD-10-CM | POA: Diagnosis not present

## 2023-03-23 DIAGNOSIS — E781 Pure hyperglyceridemia: Secondary | ICD-10-CM | POA: Diagnosis not present

## 2023-03-23 DIAGNOSIS — Z79899 Other long term (current) drug therapy: Secondary | ICD-10-CM | POA: Diagnosis not present

## 2023-03-29 DIAGNOSIS — M79644 Pain in right finger(s): Secondary | ICD-10-CM | POA: Diagnosis not present

## 2023-04-17 DIAGNOSIS — M13841 Other specified arthritis, right hand: Secondary | ICD-10-CM | POA: Diagnosis not present

## 2023-06-06 DIAGNOSIS — Z83719 Family history of colon polyps, unspecified: Secondary | ICD-10-CM | POA: Diagnosis not present

## 2023-06-06 DIAGNOSIS — Z1211 Encounter for screening for malignant neoplasm of colon: Secondary | ICD-10-CM | POA: Diagnosis not present

## 2023-06-22 DIAGNOSIS — I10 Essential (primary) hypertension: Secondary | ICD-10-CM | POA: Diagnosis not present

## 2023-07-09 DIAGNOSIS — N400 Enlarged prostate without lower urinary tract symptoms: Secondary | ICD-10-CM | POA: Diagnosis not present

## 2023-07-09 DIAGNOSIS — I10 Essential (primary) hypertension: Secondary | ICD-10-CM | POA: Diagnosis not present

## 2023-07-09 DIAGNOSIS — E781 Pure hyperglyceridemia: Secondary | ICD-10-CM | POA: Diagnosis not present

## 2023-08-09 DIAGNOSIS — E781 Pure hyperglyceridemia: Secondary | ICD-10-CM | POA: Diagnosis not present

## 2023-08-09 DIAGNOSIS — N400 Enlarged prostate without lower urinary tract symptoms: Secondary | ICD-10-CM | POA: Diagnosis not present

## 2023-08-09 DIAGNOSIS — I10 Essential (primary) hypertension: Secondary | ICD-10-CM | POA: Diagnosis not present

## 2023-08-13 DIAGNOSIS — Z1211 Encounter for screening for malignant neoplasm of colon: Secondary | ICD-10-CM | POA: Diagnosis not present

## 2023-08-13 DIAGNOSIS — D122 Benign neoplasm of ascending colon: Secondary | ICD-10-CM | POA: Diagnosis not present

## 2023-08-13 DIAGNOSIS — K5730 Diverticulosis of large intestine without perforation or abscess without bleeding: Secondary | ICD-10-CM | POA: Diagnosis not present

## 2023-08-13 DIAGNOSIS — D120 Benign neoplasm of cecum: Secondary | ICD-10-CM | POA: Diagnosis not present

## 2023-08-13 DIAGNOSIS — D123 Benign neoplasm of transverse colon: Secondary | ICD-10-CM | POA: Diagnosis not present

## 2023-08-13 DIAGNOSIS — K635 Polyp of colon: Secondary | ICD-10-CM | POA: Diagnosis not present

## 2023-08-20 DIAGNOSIS — I10 Essential (primary) hypertension: Secondary | ICD-10-CM | POA: Diagnosis not present

## 2023-09-09 DIAGNOSIS — I10 Essential (primary) hypertension: Secondary | ICD-10-CM | POA: Diagnosis not present

## 2023-09-09 DIAGNOSIS — E781 Pure hyperglyceridemia: Secondary | ICD-10-CM | POA: Diagnosis not present

## 2023-09-09 DIAGNOSIS — N400 Enlarged prostate without lower urinary tract symptoms: Secondary | ICD-10-CM | POA: Diagnosis not present

## 2023-09-17 ENCOUNTER — Ambulatory Visit (INDEPENDENT_AMBULATORY_CARE_PROVIDER_SITE_OTHER): Payer: Medicare HMO | Admitting: Otolaryngology

## 2023-09-19 DIAGNOSIS — I10 Essential (primary) hypertension: Secondary | ICD-10-CM | POA: Diagnosis not present

## 2023-10-09 DIAGNOSIS — I10 Essential (primary) hypertension: Secondary | ICD-10-CM | POA: Diagnosis not present

## 2023-10-09 DIAGNOSIS — E781 Pure hyperglyceridemia: Secondary | ICD-10-CM | POA: Diagnosis not present

## 2023-10-09 DIAGNOSIS — N400 Enlarged prostate without lower urinary tract symptoms: Secondary | ICD-10-CM | POA: Diagnosis not present

## 2023-10-17 DIAGNOSIS — Z23 Encounter for immunization: Secondary | ICD-10-CM | POA: Diagnosis not present

## 2023-10-17 DIAGNOSIS — R7303 Prediabetes: Secondary | ICD-10-CM | POA: Diagnosis not present

## 2023-10-17 DIAGNOSIS — N400 Enlarged prostate without lower urinary tract symptoms: Secondary | ICD-10-CM | POA: Diagnosis not present

## 2023-10-17 DIAGNOSIS — I10 Essential (primary) hypertension: Secondary | ICD-10-CM | POA: Diagnosis not present

## 2023-10-17 DIAGNOSIS — K760 Fatty (change of) liver, not elsewhere classified: Secondary | ICD-10-CM | POA: Diagnosis not present

## 2023-10-18 DIAGNOSIS — R7303 Prediabetes: Secondary | ICD-10-CM | POA: Diagnosis not present

## 2023-10-18 DIAGNOSIS — E669 Obesity, unspecified: Secondary | ICD-10-CM | POA: Diagnosis not present

## 2023-10-18 DIAGNOSIS — Z8249 Family history of ischemic heart disease and other diseases of the circulatory system: Secondary | ICD-10-CM | POA: Diagnosis not present

## 2023-10-18 DIAGNOSIS — Z823 Family history of stroke: Secondary | ICD-10-CM | POA: Diagnosis not present

## 2023-10-18 DIAGNOSIS — N529 Male erectile dysfunction, unspecified: Secondary | ICD-10-CM | POA: Diagnosis not present

## 2023-10-18 DIAGNOSIS — Z7984 Long term (current) use of oral hypoglycemic drugs: Secondary | ICD-10-CM | POA: Diagnosis not present

## 2023-10-18 DIAGNOSIS — Z833 Family history of diabetes mellitus: Secondary | ICD-10-CM | POA: Diagnosis not present

## 2023-10-18 DIAGNOSIS — I10 Essential (primary) hypertension: Secondary | ICD-10-CM | POA: Diagnosis not present

## 2023-10-18 DIAGNOSIS — N400 Enlarged prostate without lower urinary tract symptoms: Secondary | ICD-10-CM | POA: Diagnosis not present

## 2023-10-19 DIAGNOSIS — I10 Essential (primary) hypertension: Secondary | ICD-10-CM | POA: Diagnosis not present

## 2023-11-09 DIAGNOSIS — N400 Enlarged prostate without lower urinary tract symptoms: Secondary | ICD-10-CM | POA: Diagnosis not present

## 2023-11-09 DIAGNOSIS — I10 Essential (primary) hypertension: Secondary | ICD-10-CM | POA: Diagnosis not present

## 2023-11-09 DIAGNOSIS — E781 Pure hyperglyceridemia: Secondary | ICD-10-CM | POA: Diagnosis not present

## 2023-11-18 DIAGNOSIS — I10 Essential (primary) hypertension: Secondary | ICD-10-CM | POA: Diagnosis not present

## 2023-12-09 DIAGNOSIS — N400 Enlarged prostate without lower urinary tract symptoms: Secondary | ICD-10-CM | POA: Diagnosis not present

## 2023-12-09 DIAGNOSIS — I10 Essential (primary) hypertension: Secondary | ICD-10-CM | POA: Diagnosis not present

## 2023-12-09 DIAGNOSIS — E781 Pure hyperglyceridemia: Secondary | ICD-10-CM | POA: Diagnosis not present
